# Patient Record
Sex: Male | Born: 1980 | Race: White | Hispanic: No | Marital: Single | Smoking: Former smoker
Health system: Southern US, Community
[De-identification: ages and names within clinical notes are randomized; demographics above are authoritative.]

## PROBLEM LIST (undated history)

## (undated) DIAGNOSIS — K589 Irritable bowel syndrome without diarrhea: Secondary | ICD-10-CM

## (undated) DIAGNOSIS — F4312 Post-traumatic stress disorder, chronic: Secondary | ICD-10-CM

## (undated) DIAGNOSIS — G43909 Migraine, unspecified, not intractable, without status migrainosus: Secondary | ICD-10-CM

## (undated) HISTORY — DX: Migraine, unspecified, not intractable, without status migrainosus: G43.909

## (undated) HISTORY — PX: AMPUTATION FINGER: SHX6594

## (undated) HISTORY — PX: UPPER GASTROINTESTINAL ENDOSCOPY: SHX188

## (undated) HISTORY — PX: COLONOSCOPY: SHX5424

---

## 2008-10-31 ENCOUNTER — Emergency Department (HOSPITAL_COMMUNITY): Admission: EM | Admit: 2008-10-31 | Discharge: 2008-10-31 | Payer: Self-pay | Admitting: Emergency Medicine

## 2011-08-05 DIAGNOSIS — F4312 Post-traumatic stress disorder, chronic: Secondary | ICD-10-CM | POA: Insufficient documentation

## 2011-08-05 DIAGNOSIS — F419 Anxiety disorder, unspecified: Secondary | ICD-10-CM | POA: Insufficient documentation

## 2011-09-10 DIAGNOSIS — J029 Acute pharyngitis, unspecified: Secondary | ICD-10-CM | POA: Insufficient documentation

## 2012-04-14 DIAGNOSIS — S335XXA Sprain of ligaments of lumbar spine, initial encounter: Secondary | ICD-10-CM | POA: Insufficient documentation

## 2012-04-14 DIAGNOSIS — S76019A Strain of muscle, fascia and tendon of unspecified hip, initial encounter: Secondary | ICD-10-CM | POA: Insufficient documentation

## 2019-04-12 ENCOUNTER — Encounter: Payer: Self-pay | Admitting: *Deleted

## 2019-04-12 ENCOUNTER — Other Ambulatory Visit: Payer: Self-pay

## 2019-04-12 ENCOUNTER — Emergency Department
Admission: EM | Admit: 2019-04-12 | Discharge: 2019-04-12 | Disposition: A | Discharge status: Home / Self Care | Payer: Self-pay | Source: Home / Self Care | Attending: Family Medicine | Admitting: Family Medicine

## 2019-04-12 DIAGNOSIS — Z8719 Personal history of other diseases of the digestive system: Secondary | ICD-10-CM

## 2019-04-12 DIAGNOSIS — R197 Diarrhea, unspecified: Secondary | ICD-10-CM

## 2019-04-12 DIAGNOSIS — Z1159 Encounter for screening for other viral diseases: Secondary | ICD-10-CM

## 2019-04-12 DIAGNOSIS — J069 Acute upper respiratory infection, unspecified: Secondary | ICD-10-CM

## 2019-04-12 DIAGNOSIS — B9789 Other viral agents as the cause of diseases classified elsewhere: Secondary | ICD-10-CM

## 2019-04-12 HISTORY — DX: Irritable bowel syndrome, unspecified: K58.9

## 2019-04-12 LAB — POCT CBC W AUTO DIFF (K'VILLE URGENT CARE)

## 2019-04-12 LAB — POC SARS CORONAVIRUS 2 AG -  ED: SARS Coronavirus 2 Ag: NEGATIVE

## 2019-04-12 MED ORDER — BENZONATATE 200 MG PO CAPS: ORAL_CAPSULE | ORAL | 0 refills | Status: DC

## 2019-04-12 MED ORDER — DICYCLOMINE HCL 20 MG PO TABS: ORAL_TABLET | ORAL | 1 refills | Status: DC

## 2019-04-12 NOTE — ED Triage Notes (Signed)
Pt c/o vomiting,diarrhea, nausea, body aches, chills, sweats and nasal congestion x 2 days.

## 2019-04-12 NOTE — ED Provider Notes (Signed)
[facility]    CSN: [id number] Arrival date & time: 04/12/19  [id number]      History   Chief Complaint Chief Complaint  Patient presents with  . Emesis  . Diarrhea  . Generalized Body Aches    HPI Nathaniel Oneill is a 38 y.o. male.   About 5 days ago patient developed a partly productive cough with sinus congestion and very mild sore throat, associated with fatigue, myalgias, and headache.  Three days ago he developed intermittent nausea/vomiting, watery diarrhea, and chills/sweats.  He denies changes in taste/smell and pleuritic pain or shortness of breath.  He has a past history of pneumonia three times, but his present symptoms do not feel similar.  He has a history of IBS for which he has taken Bentyl in the past. In November 2019 he had C. Difficile colitis, and he reports that his present symptoms do not feel similar.  The history is provided by the patient.    Past Medical History:  Diagnosis Date  . IBS (irritable bowel syndrome)     There are no active problems to display for this patient.   Past Surgical History:  Procedure Laterality Date  . AMPUTATION FINGER    . COLONOSCOPY    . UPPER GASTROINTESTINAL ENDOSCOPY         Home Medications    Prior to Admission medications   Medication Sig Start Date End Date Taking? Authorizing Provider  benzonatate (TESSALON) 200 MG capsule Take one cap by mouth at bedtime as needed for cough.  May repeat in 4 to 6 hours 04/12/19   Lattie HawBeese, Clema Skousen A, MD  clonazePAM (KLONOPIN) 2 MG tablet TAKE 1 TABLET BY MOUTH TWICE A DAY AS NEEDED FOR ANXIETY 03/18/19   [provider]  dicyclomine (BENTYL) 20 MG tablet Take one tab PO TID prn diarrhea and abdominal cramping 04/12/19   Lattie HawBeese, Jelicia Nantz A, MD    Family History Family History  Problem Relation Age of Onset  . Heart failure Mother   . Diabetes Mother   . Hypertension Mother   . Migraines Mother   . Hypertension Father     Social History Social History    Tobacco Use  . Smoking status: Former Games developermoker  . Smokeless tobacco: Never Used  Substance Use Topics  . Alcohol use: Not Currently  . Drug use: Never     Allergies   Cephalexin and Duloxetine hcl   Review of Systems Review of Systems ? sore throat + cough No pleuritic pain No wheezing + nasal congestion + post-nasal drainage No sinus pain/pressure No itchy/red eyes No earache No hemoptysis No SOB No fever, + chills/sweats + nausea + vomiting No abdominal pain + diarrhea No urinary symptoms No skin rash + fatigue + myalgias + headache Used OTC meds without relief   Physical Exam Triage Vital Signs ED Triage Vitals  Enc Vitals Group     BP 04/12/19 1021 124/83     Pulse Rate 04/12/19 1021 76     Resp 04/12/19 1021 18     Temp 04/12/19 1021 98 F (36.7 C)     Temp Source 04/12/19 1021 Oral     SpO2 04/12/19 1021 98 %     Weight --      Height --      Head Circumference --      Peak Flow --      Pain Score 04/12/19 1022 0     Pain Loc --  Pain Edu? --      Excl. in [place]? --    No data found.  Updated Vital Signs BP 124/83 (BP Location: Right Arm)   Pulse 76   Temp 98 F (36.7 C) (Oral)   Resp 18   SpO2 98%   Visual Acuity Right Eye Distance:   Left Eye Distance:   Bilateral Distance:    Right Eye Near:   Left Eye Near:    Bilateral Near:     Physical Exam Nursing notes and Vital Signs reviewed. Appearance:  Patient appears stated age, and in no acute distress Eyes:  Pupils are equal, round, and reactive to light and accomodation.  Extraocular movement is intact.  Conjunctivae are not inflamed  Ears:  Canals normal (bilateral cerumen present but canals not occluded)  Tympanic membranes normal.  Nose:  Mildly congested turbinates.  No sinus tenderness.  Pharynx:  Normal Neck:  Supple.  Enlarged posterior/lateral nodes are palpated bilaterally, tender to palpation on the left.   Lungs:  Clear to auscultation.  Breath sounds are equal.   Moving air well. Heart:  Regular rate and rhythm without murmurs, rubs, or gallops.  Abdomen:  Vague diffuse mild without masses or hepatosplenomegaly.  Bowel sounds are present.  No CVA or flank tenderness.  Extremities:  No edema.  Skin:  No rash present.    UC Treatments / Results  Labs (all labs ordered are listed, but only abnormal results are displayed) Labs Reviewed -  SARS negative CBC:  WBC 6.6; LY 30.7; MO 4.9; GR 64.4; Hgb 15.5; Platelets 177  EKG   Radiology No results found.  Procedures Procedures (including critical care time)  Medications Ordered in UC Medications - No data to display  Initial Impression / Assessment and Plan / UC Course  I have reviewed the triage vital signs and the nursing notes.  Pertinent labs & imaging results that were available during my care of the patient were reviewed by me and considered in my medical decision making (see chart for details).    Normal WBC and negative SARS reassuring. Treat symptomatically for now  Rx for Bentyl 20mg  TID prn. Prescription written for Benzonatate [facility]) to take at bedtime for night-time cough.  Followup with Family Doctor if not improved in 7 to 10 days.   Final Clinical Impressions(s) / UC Diagnoses   Final diagnoses:  Viral URI with cough  Diarrhea, unspecified type  History of IBS     Discharge Instructions     Begin clear liquids (Pedialyte while having diarrhea) for about a day until improved, then advance to a [employer] (Bananas, Rice, Applesauce, Toast).  Then gradually resume a regular diet when tolerated.    Take plain guaifenesin (1200mg  extended release tabs such as Mucinex) twice daily, with plenty of water, for cough and congestion.  May add Pseudoephedrine (30mg , one or two every 4 to 6 hours) for sinus congestion.  Get adequate rest.     Stop all antihistamines for now, and other non-prescription cough/cold preparations. May take Ibuprofen 200mg , 4 tabs every 8 hours with  food for body aches, fever, etc.   If symptoms become significantly worse during the night or over the weekend, proceed to the local emergency room.     ED Prescriptions    Medication Sig Dispense Auth. Provider   dicyclomine (BENTYL) 20 MG tablet Take one tab PO TID prn diarrhea and abdominal cramping 20 tablet Kandra Nicolas, MD   benzonatate (TESSALON) 200 MG capsule Take  one cap by mouth at bedtime as needed for cough.  May repeat in 4 to 6 hours 15 capsule Cathren Harsh Tera Mater, MD         Lattie Haw, MD 04/12/19 [phone]

## 2019-04-12 NOTE — Discharge Instructions (Addendum)
Begin clear liquids (Pedialyte while having diarrhea) for about a day until improved, then advance to a [employer] (Bananas, Rice, Applesauce, Toast).  Then gradually resume a regular diet when tolerated.    Take plain guaifenesin (1200mg  extended release tabs such as Mucinex) twice daily, with plenty of water, for cough and congestion.  May add Pseudoephedrine (30mg , one or two every 4 to 6 hours) for sinus congestion.  Get adequate rest.     Stop all antihistamines for now, and other non-prescription cough/cold preparations. May take Ibuprofen 200mg , 4 tabs every 8 hours with food for body aches, fever, etc.   If symptoms become significantly worse during the night or over the weekend, proceed to the local emergency room.

## 2019-06-19 ENCOUNTER — Emergency Department (INDEPENDENT_AMBULATORY_CARE_PROVIDER_SITE_OTHER)
Admission: EM | Admit: 2019-06-19 | Discharge: 2019-06-19 | Disposition: A | Discharge status: Home / Self Care | Payer: PRIVATE HEALTH INSURANCE | Source: Home / Self Care

## 2019-06-19 ENCOUNTER — Other Ambulatory Visit: Payer: Self-pay

## 2019-06-19 DIAGNOSIS — J029 Acute pharyngitis, unspecified: Secondary | ICD-10-CM

## 2019-06-19 DIAGNOSIS — J020 Streptococcal pharyngitis: Secondary | ICD-10-CM

## 2019-06-19 DIAGNOSIS — R05 Cough: Secondary | ICD-10-CM

## 2019-06-19 DIAGNOSIS — R439 Unspecified disturbances of smell and taste: Secondary | ICD-10-CM

## 2019-06-19 DIAGNOSIS — R52 Pain, unspecified: Secondary | ICD-10-CM

## 2019-06-19 DIAGNOSIS — R059 Cough, unspecified: Secondary | ICD-10-CM

## 2019-06-19 LAB — POCT RAPID STREP A (OFFICE): Rapid Strep A Screen: NEGATIVE

## 2019-06-19 NOTE — ED Triage Notes (Signed)
States he has had a cough since Sat. Moving around creates discomfort, body aches & chills. Congestion, sore throat, hes taste o& smell are not as strong.

## 2019-06-19 NOTE — ED Provider Notes (Signed)
[name]    CSN: [id number] Arrival date & time: 06/19/19  0900      History   Chief Complaint Chief Complaint  Patient presents with  . Sore Throat    HPI Nathaniel Oneill is a 38 y.o. male.   HPI Nathaniel Oneill is a 38 y.o. male presenting to UC with c/o 2 days of worsening cold-like symptoms.  Associated congestion, sore throat, body aches and chills.  Today he noticed his taste and smell are not as strong as usual.  He has been able to eat and drink like normal. No known sick contacts but he would like to be tested for Covid. Denies n/v/d. No fever. No chest pain or SOB.   Past Medical History:  Diagnosis Date  . IBS (irritable bowel syndrome)     There are no active problems to display for this patient.   Past Surgical History:  Procedure Laterality Date  . AMPUTATION FINGER    . COLONOSCOPY    . UPPER GASTROINTESTINAL ENDOSCOPY         Home Medications    Prior to Admission medications   Medication Sig Start Date End Date Taking? Authorizing Provider  benzonatate (TESSALON) 200 MG capsule Take one cap by mouth at bedtime as needed for cough.  May repeat in 4 to 6 hours 04/12/19   Lattie Haw, MD  clonazePAM (KLONOPIN) 2 MG tablet TAKE 1 TABLET BY MOUTH TWICE A DAY AS NEEDED FOR ANXIETY 03/18/19   [provider]  dicyclomine (BENTYL) 20 MG tablet Take one tab PO TID prn diarrhea and abdominal cramping 04/12/19   Lattie Haw, MD    Family History Family History  Problem Relation Age of Onset  . Heart failure Mother   . Diabetes Mother   . Hypertension Mother   . Migraines Mother   . Hypertension Father     Social History Social History   Tobacco Use  . Smoking status: Former Games developer  . Smokeless tobacco: Never Used  Substance Use Topics  . Alcohol use: Not Currently  . Drug use: Never     Allergies   Cephalexin and Duloxetine hcl   Review of Systems Review of Systems  Constitutional: Positive for chills. Negative  for fever.  HENT: Positive for congestion and sore throat. Negative for ear pain, trouble swallowing and voice change.   Respiratory: Negative for cough and shortness of breath.   Cardiovascular: Negative for chest pain and palpitations.  Gastrointestinal: Negative for abdominal pain, diarrhea, nausea and vomiting.  Musculoskeletal:       Body aches  Skin: Negative for rash.  Neurological: Positive for headaches. Negative for dizziness and light-headedness.     Physical Exam Triage Vital Signs ED Triage Vitals  Enc Vitals Group     BP 06/19/19 0922 122/89     Pulse Rate 06/19/19 0922 73     Resp 06/19/19 0922 18     Temp 06/19/19 0922 98 F (36.7 C)     Temp Source 06/19/19 0922 Oral     SpO2 06/19/19 0922 96 %     Weight 06/19/19 0923 225 lb (102.1 kg)     Height --      Head Circumference --      Peak Flow --      Pain Score 06/19/19 0923 7     Pain Loc --      Pain Edu? --      Excl. in [place]? --    No  data found.  Updated Vital Signs BP 122/89 (BP Location: Left Arm)   Pulse 73   Temp 98 F (36.7 C) (Oral)   Resp 18   Wt 225 lb (102.1 kg)   SpO2 96%   Visual Acuity Right Eye Distance:   Left Eye Distance:   Bilateral Distance:    Right Eye Near:   Left Eye Near:    Bilateral Near:     Physical Exam Vitals signs and nursing note reviewed.  Constitutional:      Appearance: He is well-developed.  HENT:     Head: Normocephalic and atraumatic.     Right Ear: Tympanic membrane and ear canal normal.     Left Ear: Tympanic membrane and ear canal normal.     Nose: Nose normal.     Right Sinus: No maxillary sinus tenderness or frontal sinus tenderness.     Left Sinus: No maxillary sinus tenderness or frontal sinus tenderness.     Mouth/Throat:     Lips: Pink.     Mouth: Mucous membranes are moist.     Pharynx: Oropharynx is clear. Uvula midline.  Neck:     Musculoskeletal: Normal range of motion and neck supple.  Cardiovascular:     Rate and Rhythm: Normal  rate and regular rhythm.  Pulmonary:     Effort: Pulmonary effort is normal. No respiratory distress.     Breath sounds: Normal breath sounds. No stridor. No wheezing, rhonchi or rales.  Musculoskeletal: Normal range of motion.  Lymphadenopathy:     Cervical: No cervical adenopathy.  Skin:    General: Skin is warm and dry.  Neurological:     Mental Status: He is alert and oriented to person, place, and time.  Psychiatric:        Behavior: Behavior normal.      UC Treatments / Results  Labs (all labs ordered are listed, but only abnormal results are displayed) Labs Reviewed  NOVEL CORONAVIRUS, NAA  STREP A DNA PROBE  POCT RAPID STREP A (OFFICE)    EKG   Radiology No results found.  Procedures Procedures (including critical care time)  Medications Ordered in UC Medications - No data to display  Initial Impression / Assessment and Plan / UC Course  I have reviewed the triage vital signs and the nursing notes.  Pertinent labs & imaging results that were available during my care of the patient were reviewed by me and considered in my medical decision making (see chart for details).  Clinical Course as of Jun 19 921  Mon Jun 19, 2019  1660 SARS CORONAVIRUS 2 (TAT 6-24 HRS) Nasopharyngeal Nasopharyngeal Swab [EP]    Clinical Course User Index [EP] Noe Gens, PA-C     Covid test: pending Encouraged symptomatic tx AVS provided   Final Clinical Impressions(s) / UC Diagnoses   Final diagnoses:  Cough  Body aches  Sore throat  Decreased taste and smell     Discharge Instructions      No Primary Care Doctor: - Call [facility] at  [phone] - they can help you locate a primary care doctor that  accepts your insurance, provides certain services, etc. - Physician Referral Service- [phone]  You may take 500mg  acetaminophen every 4-6 hours or in combination with ibuprofen 400-600mg  every 6-8 hours as needed for pain, inflammation, and fever.   Be sure to well hydrated with clear liquids and get at least 8 hours of sleep at night, preferably more while sick.   Please  follow up with family medicine in 1 week if needed.   Due to concern for possibly having Covid-19, it is advised that you self-isolate at home until test results come back in 2-5 days.  If positive, it is recommended you stay isolated for at least 10 days after symptom onset and 24 hours after last fever without taking medication (whichever is longer), with improving symptoms.  If you MUST go out, please wear a mask at all times, limit contact with others.      ED Prescriptions    None     PDMP not reviewed this encounter.   Lurene Shadowhelps, Hau Sanor O, [place]-C 06/20/19 [phone]

## 2019-06-19 NOTE — Discharge Instructions (Signed)
°  No Primary Care Doctor: Call [facility] at  [phone] - they can help you locate a primary care doctor that  accepts your insurance, provides certain services, etc. Physician Referral Service- [phone]  You may take 500mg  acetaminophen every 4-6 hours or in combination with ibuprofen 400-600mg  every 6-8 hours as needed for pain, inflammation, and fever.  Be sure to well hydrated with clear liquids and get at least 8 hours of sleep at night, preferably more while sick.   Please follow up with family medicine in 1 week if needed.   Due to concern for possibly having Covid-19, it is advised that you self-isolate at home until test results come back in 2-5 days.  If positive, it is recommended you stay isolated for at least 10 days after symptom onset and 24 hours after last fever without taking medication (whichever is longer), with improving symptoms.  If you MUST go out, please wear a mask at all times, limit contact with others.

## 2019-06-20 LAB — STREP A DNA PROBE: Group A Strep Probe: NOT DETECTED

## 2019-06-22 LAB — NOVEL CORONAVIRUS, NAA: SARS-CoV-2, NAA: NOT DETECTED

## 2019-08-03 ENCOUNTER — Other Ambulatory Visit: Payer: Self-pay

## 2019-08-03 ENCOUNTER — Emergency Department (INDEPENDENT_AMBULATORY_CARE_PROVIDER_SITE_OTHER)
Admission: EM | Admit: 2019-08-03 | Discharge: 2019-08-03 | Disposition: A | Discharge status: Home / Self Care | Payer: Commercial Managed Care - PPO | Source: Home / Self Care | Attending: Family Medicine | Admitting: Family Medicine

## 2019-08-03 ENCOUNTER — Encounter: Payer: Self-pay | Admitting: *Deleted

## 2019-08-03 DIAGNOSIS — J069 Acute upper respiratory infection, unspecified: Secondary | ICD-10-CM

## 2019-08-03 DIAGNOSIS — R059 Cough, unspecified: Secondary | ICD-10-CM

## 2019-08-03 DIAGNOSIS — R05 Cough: Secondary | ICD-10-CM

## 2019-08-03 NOTE — Discharge Instructions (Addendum)
Take plain guaifenesin (1200mg  extended release tabs such as Mucinex) twice daily, with plenty of water, for cough and congestion.  May add Pseudoephedrine (30mg , one or two every 4 to 6 hours) for sinus congestion.  Get adequate rest.   May use Afrin nasal spray (or generic oxymetazoline) each morning for about 5 days and then discontinue.  Also recommend using saline nasal spray several times daily and saline nasal irrigation (AYR is a common brand).  Use Flonase nasal spray each morning after using Afrin nasal spray and saline nasal irrigation. Try warm salt water gargles for sore throat.  Stop all antihistamines for now, and other non-prescription cough/cold preparations. May take Ibuprofen 200mg , 4 tabs every 8 hours with food for body aches, headache, fever, etc. May take Delsym Cough Suppressant at bedtime for nighttime cough.   Isolate yourself until COVID-19 test result is available.   If your COVID19 test is positive, then you are infected with the novel coronavirus and could give the virus to others.  Please continue isolation at home for at least 10 days since the start of your symptoms.  Once you complete your 10 day quarantine, you may return to normal activities as long as you've not had a fever for over 24 hours (without taking fever reducing medicine) and your symptoms are improving. Please continue good preventive care measures, including:  frequent hand-washing, avoid touching your face, cover coughs/sneezes, stay out of crowds and keep a 6 foot distance from others.  Go to the nearest hospital emergency room if fever/cough/breathlessness are severe or illness seems like a threat to life.

## 2019-08-03 NOTE — ED Triage Notes (Addendum)
Pt co body aches, back aches, mild cough, diarrhea, vomiting, nausea, and temp 100.1 x 2 days.

## 2019-08-03 NOTE — ED Provider Notes (Signed)
[facility]    CSN: [id number] Arrival date & time: 08/03/19  1135      History   Chief Complaint Chief Complaint  Patient presents with  . Generalized Body Aches    HPI Nathaniel Oneill is a 39 y.o. male.   Four days ago patient developed myalgias, headache, fatigue, minimal sore throat, nasal discharge, and nausea/vomiting/diarrhea.  He has developed a cough, worse at night, fever to 100+, chills/sweats, and changes in taste/smell.  He denies shortness of breath and pleuritic pain.  The history is provided by the patient.    Past Medical History:  Diagnosis Date  . IBS (irritable bowel syndrome)     There are no problems to display for this patient.   Past Surgical History:  Procedure Laterality Date  . AMPUTATION FINGER    . COLONOSCOPY    . UPPER GASTROINTESTINAL ENDOSCOPY         Home Medications    Prior to Admission medications   Medication Sig Start Date End Date Taking? Authorizing Provider  clonazePAM (KLONOPIN) 2 MG tablet TAKE 1 TABLET BY MOUTH TWICE A DAY AS NEEDED FOR ANXIETY 03/18/19  Yes [provider]    Family History Family History  Problem Relation Age of Onset  . Heart failure Mother   . Diabetes Mother   . Hypertension Mother   . Migraines Mother   . Hypertension Father     Social History Social History   Tobacco Use  . Smoking status: Former Games developer  . Smokeless tobacco: Never Used  Substance Use Topics  . Alcohol use: Not Currently  . Drug use: Never     Allergies   Cephalexin and Duloxetine hcl   Review of Systems Review of Systems + sore throat + cough No pleuritic pain No wheezing + nasal congestion + post-nasal drainage No sinus pain/pressure No itchy/red eyes No earache No hemoptysis No SOB + fever, + chills + nausea + vomiting No abdominal pain + diarrhea No urinary symptoms No skin rash + fatigue + myalgias + headache Used OTC meds (Tylenol) without relief   Physical  Exam Triage Vital Signs ED Triage Vitals  Enc Vitals Group     BP 08/03/19 1259 136/89     Pulse Rate 08/03/19 1259 79     Resp 08/03/19 1259 18     Temp 08/03/19 1259 97.9 F (36.6 C)     Temp Source 08/03/19 1259 Oral     SpO2 08/03/19 1259 97 %     Weight 08/03/19 1254 230 lb (104.3 kg)     Height 08/03/19 1254 6' (1.829 m)     Head Circumference --      Peak Flow --      Pain Score 08/03/19 1254 0     Pain Loc --      Pain Edu? --      Excl. in [place]? --    No data found.  Updated Vital Signs BP 136/89 (BP Location: Right Arm)   Pulse 79   Temp 97.9 F (36.6 C) (Oral)   Resp 18   Ht 6' (1.829 m)   Wt 104.3 kg   SpO2 97%   BMI 31.19 kg/m   Visual Acuity Right Eye Distance:   Left Eye Distance:   Bilateral Distance:    Right Eye Near:   Left Eye Near:    Bilateral Near:     Physical Exam Nursing notes and Vital Signs reviewed. Appearance:  Patient appears stated age, and  in no acute distress Eyes:  Pupils are equal, round, and reactive to light and accomodation.  Extraocular movement is intact.  Conjunctivae are not inflamed  Ears:  Canals normal.  Tympanic membranes normal.  Nose:  Mildly congested turbinates.  No sinus tenderness. Pharynx:  Normal Neck:  Supple.  Mildly enlarged lateral nodes are present, tender to palpation on the left.   Lungs:  Clear to auscultation.  Breath sounds are equal.  Moving air well. Heart:  Regular rate and rhythm without murmurs, rubs, or gallops.  Abdomen:  Nontender without masses or hepatosplenomegaly.  Bowel sounds are present.  No CVA or flank tenderness.  Extremities:  No edema.  Skin:  No rash present.   UC Treatments / Results  Labs (all labs ordered are listed, but only abnormal results are displayed) Labs Reviewed  NOVEL CORONAVIRUS, NAA    EKG   Radiology No results found.  Procedures Procedures (including critical care time)  Medications Ordered in UC Medications - No data to display  Initial  Impression / Assessment and Plan / UC Course  I have reviewed the triage vital signs and the nursing notes.  Pertinent labs & imaging results that were available during my care of the patient were reviewed by me and considered in my medical decision making (see chart for details).    There is no evidence of bacterial infection today.  Suspect COVID19. COVID19 send out. Treat symptomatically for now    Final Clinical Impressions(s) / UC Diagnoses   Final diagnoses:  Cough  Viral URI with cough     Discharge Instructions     Take plain guaifenesin (1200mg  extended release tabs such as Mucinex) twice daily, with plenty of water, for cough and congestion.  May add Pseudoephedrine (30mg , one or two every 4 to 6 hours) for sinus congestion.  Get adequate rest.   May use Afrin nasal spray (or generic oxymetazoline) each morning for about 5 days and then discontinue.  Also recommend using saline nasal spray several times daily and saline nasal irrigation (AYR is a common brand).  Use Flonase nasal spray each morning after using Afrin nasal spray and saline nasal irrigation. Try warm salt water gargles for sore throat.  Stop all antihistamines for now, and other non-prescription cough/cold preparations. May take Ibuprofen 200mg , 4 tabs every 8 hours with food for body aches, headache, fever, etc. May take Delsym Cough Suppressant at bedtime for nighttime cough.   Isolate yourself until COVID-19 test result is available.   If your COVID19 test is positive, then you are infected with the novel coronavirus and could give the virus to others.  Please continue isolation at home for at least 10 days since the start of your symptoms.  Once you complete your 10 day quarantine, you may return to normal activities as long as you've not had a fever for over 24 hours (without taking fever reducing medicine) and your symptoms are improving. Please continue good preventive care measures, including:  frequent  hand-washing, avoid touching your face, cover coughs/sneezes, stay out of crowds and keep a 6 foot distance from others.  Go to the nearest hospital emergency room if fever/cough/breathlessness are severe or illness seems like a threat to life.     ED Prescriptions    None        Kandra Nicolas, MD 08/05/19 1347

## 2019-08-05 LAB — NOVEL CORONAVIRUS, NAA: SARS-CoV-2, NAA: NOT DETECTED

## 2020-02-01 ENCOUNTER — Other Ambulatory Visit: Payer: Self-pay

## 2020-02-01 ENCOUNTER — Encounter: Payer: Self-pay | Admitting: Emergency Medicine

## 2020-02-01 ENCOUNTER — Emergency Department (INDEPENDENT_AMBULATORY_CARE_PROVIDER_SITE_OTHER)
Admission: EM | Admit: 2020-02-01 | Discharge: 2020-02-01 | Disposition: A | Discharge status: Home / Self Care | Payer: Commercial Managed Care - PPO | Source: Home / Self Care

## 2020-02-01 ENCOUNTER — Emergency Department (INDEPENDENT_AMBULATORY_CARE_PROVIDER_SITE_OTHER): Payer: Commercial Managed Care - PPO

## 2020-02-01 DIAGNOSIS — M79674 Pain in right toe(s): Secondary | ICD-10-CM | POA: Diagnosis not present

## 2020-02-01 DIAGNOSIS — M79671 Pain in right foot: Secondary | ICD-10-CM

## 2020-02-01 DIAGNOSIS — R0789 Other chest pain: Secondary | ICD-10-CM

## 2020-02-01 DIAGNOSIS — M25571 Pain in right ankle and joints of right foot: Secondary | ICD-10-CM

## 2020-02-01 HISTORY — DX: Post-traumatic stress disorder, chronic: F43.12

## 2020-02-01 NOTE — Discharge Instructions (Signed)
  You may take 500mg  acetaminophen every 4-6 hours or in combination with ibuprofen 400-600mg  every 6-8 hours as needed for pain and inflammation.  Call to schedule a follow up exam in 1 week with Sports Medicine if not improving. It is also recommended you establish care with a primary care provider for ongoing healthcare needs.
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
## 2020-02-12 ENCOUNTER — Ambulatory Visit (INDEPENDENT_AMBULATORY_CARE_PROVIDER_SITE_OTHER): Payer: Self-pay | Admitting: Sports Medicine

## 2020-02-12 ENCOUNTER — Encounter: Payer: Self-pay | Admitting: Sports Medicine

## 2020-02-12 ENCOUNTER — Other Ambulatory Visit: Payer: Self-pay

## 2020-02-12 ENCOUNTER — Ambulatory Visit (INDEPENDENT_AMBULATORY_CARE_PROVIDER_SITE_OTHER): Payer: Self-pay

## 2020-02-12 DIAGNOSIS — M5416 Radiculopathy, lumbar region: Secondary | ICD-10-CM

## 2020-02-12 DIAGNOSIS — M79671 Pain in right foot: Secondary | ICD-10-CM

## 2020-02-12 MED ORDER — PREDNISONE 50 MG PO TABS: ORAL_TABLET | ORAL | 0 refills | Status: DC
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
## 2020-02-22 ENCOUNTER — Ambulatory Visit (INDEPENDENT_AMBULATORY_CARE_PROVIDER_SITE_OTHER): Payer: Self-pay | Admitting: Rehabilitative and Restorative Service Providers"

## 2020-02-22 ENCOUNTER — Other Ambulatory Visit: Payer: Self-pay

## 2020-02-22 DIAGNOSIS — R2689 Other abnormalities of gait and mobility: Secondary | ICD-10-CM

## 2020-02-22 DIAGNOSIS — M5416 Radiculopathy, lumbar region: Secondary | ICD-10-CM

## 2020-02-22 DIAGNOSIS — R29810 Facial weakness: Secondary | ICD-10-CM

## 2020-02-22 DIAGNOSIS — M79671 Pain in right foot: Secondary | ICD-10-CM

## 2020-02-22 DIAGNOSIS — R29898 Other symptoms and signs involving the musculoskeletal system: Secondary | ICD-10-CM
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
## 2020-02-22 NOTE — Patient Instructions (Signed)
Access Code: [email or web address]: [email or web address]: 07/29/2021Prepared by: Belem Hintze HoltExercises  Prone Press Up - 2 x daily - 7 x weekly - 1 sets - 10 reps - 2-3 sec hold  Supine Piriformis Stretch with Leg Straight - 2 x daily - 7 x weekly - 1 sets - 3 reps - 30 sec hold  Hooklying Hamstring Stretch with Strap - 2 x daily - 7 x weekly - 1 sets - 3 reps - 30 sec hold  Long Sitting Ankle Pumps - 2 x daily - 7 x weekly - 1-2 sets - 10 reps - 2-3 sec hold  Seated Ankle Circles - 2 x daily - 7 x weekly - 1-2 sets - 10 reps - 2-3 sec hold Patient Education  [place] Patient Instructions

## 2020-03-05 ENCOUNTER — Ambulatory Visit: Payer: Commercial Managed Care - PPO | Admitting: Sports Medicine

## 2020-03-05 ENCOUNTER — Telehealth: Payer: Self-pay | Admitting: *Deleted

## 2020-03-05 NOTE — Telephone Encounter (Signed)
Pt left vm wanting a call back regarding his last appointment with Dr. Karie Schwalbe.  I turned his call and left vm.

## 2020-03-06 ENCOUNTER — Telehealth: Payer: Self-pay | Admitting: *Deleted

## 2020-03-06 ENCOUNTER — Ambulatory Visit (INDEPENDENT_AMBULATORY_CARE_PROVIDER_SITE_OTHER): Payer: Self-pay | Admitting: Physical Therapy

## 2020-03-06 ENCOUNTER — Other Ambulatory Visit: Payer: Self-pay

## 2020-03-06 ENCOUNTER — Encounter: Payer: Self-pay | Admitting: Physical Therapy

## 2020-03-06 DIAGNOSIS — R29898 Other symptoms and signs involving the musculoskeletal system: Secondary | ICD-10-CM

## 2020-03-06 DIAGNOSIS — M79671 Pain in right foot: Secondary | ICD-10-CM

## 2020-03-06 DIAGNOSIS — M5416 Radiculopathy, lumbar region: Secondary | ICD-10-CM

## 2020-03-06 NOTE — Patient Instructions (Signed)
Access Code: [email or web address]: [email or web address]: 08/11/2021Prepared by: [facility]  Prone Press Up - 2 x daily - 7 x weekly - 1 sets - 10 reps - 2-3 sec hold  Supine Piriformis Stretch with Leg Straight - 2 x daily - 7 x weekly - 1 sets - 3 reps - 30 sec hold  Hooklying Hamstring Stretch with Strap - 2 x daily - 7 x weekly - 1 sets - 3 reps - 30 sec hold  Long Sitting Ankle Pumps - 2 x daily - 7 x weekly - 1-2 sets - 10 reps - 2-3 sec hold  Seated Ankle Circles - 2 x daily - 7 x weekly - 1-2 sets - 10 reps - 2-3 sec hold  Seated Heel Toe Raises - 2 x daily - 7 x weekly - 1 sets - 10 reps  Seated Hamstring Stretch - 2 x daily - 7 x weekly - 1 sets - 2 reps - 20 hold  Seated Ankle Inversion Eversion AROM - 2 x daily - 7 x weekly - 1 sets - 10 reps   Pt instructed on exercises, but did not receive handout.
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
## 2020-03-06 NOTE — Therapy (Signed)
[facility] [place] [address] [place], [place], [address] Phone: [phone]   Fax:  [phone]  Physical Therapy Treatment  Patient Details  Name: Nathaniel Oneill MRN: [id number] Date of Birth: June 28, 1981 Referring Provider (PT): Dr Megan Salon    Encounter Date: 03/06/2020   PT End of Session - 03/06/20 1613    Visit Number 2    Number of Visits 12    Date for PT Re-Evaluation 04/04/20    PT Start Time 1608    PT Stop Time 1646    PT Time Calculation (min) 38 min    Activity Tolerance Patient tolerated treatment well    Behavior During Therapy [facility] for tasks assessed/performed           Past Medical History:  Diagnosis Date  . Chronic post-traumatic stress disorder (PTSD)   . IBS (irritable bowel syndrome)   . Migraine     Past Surgical History:  Procedure Laterality Date  . AMPUTATION FINGER    . COLONOSCOPY    . UPPER GASTROINTESTINAL ENDOSCOPY      There were no vitals filed for this visit.   Subjective Assessment - 03/06/20 1614    Subjective Pt reports his pain has improved some since last visit; he states he can bear weight in RLE with less pain. He had no burning in his foot until walking in parking lot.  He is still wearing his orthopedic shoe issued by Dr T.  He stopped doing his back exercises as they began to make back feel worse.    Pertinent History buldging disc L5/S1 2012 resolved; anxiety; allegries; no surgeries    Patient Stated Goals get rid of the foot pain    Currently in Pain? Yes    Pain Score 3     Pain Location Foot    Pain Orientation Right    Pain Descriptors / Indicators Aching    Aggravating Factors  weight on foot.    Pain Relieving Factors rest              [facility] PT Assessment - 03/06/20 0001      Assessment   Medical Diagnosis Rt foot pain: lumbar radiculitis     Referring Provider (PT) Dr Megan Salon     Onset Date/Surgical Date 01/24/20    Hand Dominance Right     Next MD Visit 03/05/20    Prior Therapy yes for HNP 2012           [facility] Adult PT Treatment/Exercise - 03/06/20 0001      Lumbar Exercises: Stretches   Passive Hamstring Stretch Right;2 reps;30 seconds   supine with strap    Passive Hamstring Stretch Limitations trial of Rt/Lt seated with straight back x 20 sec (no back pain)    Prone on Elbows Stretch 1 rep;30 seconds    Press Ups 3 reps;10 seconds    Piriformis Stretch Right;Left;30 seconds;2 reps   Trial seated     Lumbar Exercises: Aerobic   Nustep L4: legs only 6 min - cues to speed up and keep pedaling      Iontophoresis   Type of Iontophoresis Dexamethasone    Location medial to lateral Rt malleolus/lateral talus     Dose 80 mAmp; 1 cc    Time 4 hr       Manual Therapy   Manual Therapy Taping    Manual therapy comments I strips of Reg Rock tape applied to Rt dorsum of foot to decompress tissue, decrease pain  and increase proprioception.       Ankle Exercises: Supine   Other Supine Ankle Exercises ankle pumps x 10; ankle circles CW/CCW x 10 each Rt ankle       Ankle Exercises: Stretches   Gastroc Stretch 1 rep;30 seconds   long sitting with strap     Ankle Exercises: Seated   Other Seated Ankle Exercises heel/toe raises x 10 reps;  AROM bilat eversion x 10                  PT Education - 03/06/20 1705    Education Details rock tape rationale and safe removal;  HEP    Person(s) Educated Patient    Methods Explanation;Demonstration;Verbal cues    Comprehension Verbalized understanding;Returned demonstration               PT Long Term Goals - 02/22/20 1740      PT LONG TERM GOAL #1   Title Decrease pain 75-100% Rt foot and toes allowing patient to stand and walk with normal gait pattern for functional distances and times    Time 6    Period Weeks    Status New    Target Date 04/04/20      PT LONG TERM GOAL #2   Title 5/5 strength Rt LE    Time 6    Period Weeks    Status New    Target Date  04/04/20      PT LONG TERM GOAL #3   Title Improve Rt LE mobility and ROM with Rt LE equal to Lt    Time 6    Period Weeks    Status New    Target Date 04/04/20      PT LONG TERM GOAL #4   Title Independent in HEP    Time 6    Period Weeks    Status New    Target Date 04/04/20      PT LONG TERM GOAL #5   Title Improve FOTO to </= 35% limitation    Time 6    Period Weeks    Status New    Target Date 04/04/20                 Plan - 03/06/20 1656    Clinical Impression Statement Positive response to ionto last visit.  Pt had stopped performing travell stretch as he said it was irritating his back (he described it as "twisting his back"); will need to revisit this stretch with proper technique.  Pt's Rt foot fatigues quickly with PF/DF exercise.  Trial of Rock tape applied to dorsum of foot.  Goals are ongoing.    Rehab Potential Good    PT Frequency 2x / week    PT Duration 6 weeks    PT Treatment/Interventions Patient/family education;ADLs/Self Care Home Management;Aquatic Therapy;Cryotherapy;Electrical Stimulation;Iontophoresis 4mg /ml Dexamethasone;Moist Heat;Ultrasound;Gait training;Stair training;Functional mobility training;Therapeutic activities;Therapeutic exercise;Balance training;Neuromuscular re-education;Manual techniques;Dry needling;Taping;Vasopneumatic Device    PT Next Visit Plan review HEP; progress with ROM and strengthening for Rt foot and ankle; extension program for lumbar spine; stretching Rt LE; core stabilization ;gait training as needed; manual work and modalities as indicated; assess response to tape.    PT Home Exercise Plan [email or web address]    Consulted and Agree with Plan of Care Patient           Patient will benefit from skilled therapeutic intervention in order to improve the following deficits and impairments:     Visit Diagnosis: Left lumbar radiculitis  Pain in right foot  Other symptoms and signs involving the musculoskeletal  system     Problem List Patient Active Problem List   Diagnosis Date Noted  . Right lumbar radiculitis 02/12/2020  . Right foot pain 02/12/2020  . Anxiety disorder 08/05/2011  . Chronic post-traumatic stress disorder 08/05/2011   Mayer Camel, PTA 03/06/20 5:09 PM  [facility] [place] [address] [place], [place], [address] Phone: [phone]   Fax:  [phone]  Name: Jayshun Galentine MRN: [id number] Date of Birth: 1980-12-04

## 2020-03-06 NOTE — Telephone Encounter (Signed)
Yes that is fine, but I really need dates.  LOL.

## 2020-03-07 ENCOUNTER — Encounter: Payer: Self-pay | Admitting: Sports Medicine

## 2020-03-07 ENCOUNTER — Ambulatory Visit (INDEPENDENT_AMBULATORY_CARE_PROVIDER_SITE_OTHER): Payer: Self-pay | Admitting: Rehabilitative and Restorative Service Providers"

## 2020-03-07 ENCOUNTER — Encounter: Payer: Self-pay | Admitting: Rehabilitative and Restorative Service Providers"

## 2020-03-07 DIAGNOSIS — R29898 Other symptoms and signs involving the musculoskeletal system: Secondary | ICD-10-CM

## 2020-03-07 DIAGNOSIS — M79671 Pain in right foot: Secondary | ICD-10-CM

## 2020-03-07 DIAGNOSIS — M5416 Radiculopathy, lumbar region: Secondary | ICD-10-CM

## 2020-03-07 NOTE — Telephone Encounter (Signed)
Pt stated yesterday that the letter does not need specific dates.

## 2020-03-07 NOTE — Telephone Encounter (Signed)
Done

## 2020-03-07 NOTE — Telephone Encounter (Signed)
Pt notified of letter placed up front.

## 2020-03-07 NOTE — Therapy (Signed)
[facility] [place] [address] [place], [place], [address] Phone: [phone]   Fax:  [phone]  Physical Therapy Treatment  Patient Details  Name: Nathaniel Oneill MRN: [id number] Date of Birth: 1980/08/24 Referring Provider (PT): Dr Megan Salon    Encounter Date: 03/07/2020   PT End of Session - 03/07/20 1619    Visit Number 3    Number of Visits 12    Date for PT Re-Evaluation 04/04/20    PT Start Time 1618    PT Stop Time 1658    PT Time Calculation (min) 40 min    Activity Tolerance Patient tolerated treatment well           Past Medical History:  Diagnosis Date  . Chronic post-traumatic stress disorder (PTSD)   . IBS (irritable bowel syndrome)   . Migraine     Past Surgical History:  Procedure Laterality Date  . AMPUTATION FINGER    . COLONOSCOPY    . UPPER GASTROINTESTINAL ENDOSCOPY      There were no vitals filed for this visit.   Subjective Assessment - 03/07/20 1624    Subjective Can tell his foot is getting better overall but it hurts more today. He has been out of the boot all day today and can really tell the foot and ankle is sore. Only a couple of episodes of burning today. Back is hurting some when it was not hurting before. Not doing the exercises again.    Currently in Pain? Yes    Pain Score 5     Pain Location Foot    Pain Orientation Right    Pain Descriptors / Indicators Sore;Aching    Pain Type Acute pain    Pain Onset More than a month ago    Pain Frequency Intermittent                             [facility] Adult PT Treatment/Exercise - 03/07/20 0001      Lumbar Exercises: Stretches   Passive Hamstring Stretch Right;2 reps;30 seconds   supine with strap    Press Ups 5 reps   2-3 sec hold    Piriformis Stretch Right;Left;30 seconds;2 reps   supine travell    Gastroc Stretch Right;2 reps;30 seconds      Lumbar Exercises: Aerobic   Nustep L5 UE(10) x 6 min        Iontophoresis   Type of Iontophoresis Dexamethasone    Location medial to lateral Rt malleolus/lateral talus     Dose 120 mAmp; 1 cc    Time 8 hr      Manual Therapy   Manual Therapy Taping    Manual therapy comments I strips of Reg Rock tape applied to Rt dorsum of foot to decompress tissue, decrease pain and increase proprioception.       Ankle Exercises: Stretches   Gastroc Stretch 2 reps;30 seconds   standing      Ankle Exercises: Supine   Other Supine Ankle Exercises ankle pumps x 10; ankle circles CW/CCW x 10 each Rt ankle                        PT Long Term Goals - 02/22/20 1740      PT LONG TERM GOAL #1   Title Decrease pain 75-100% Rt foot and toes allowing patient to stand and walk with normal gait pattern for functional distances and times  Time 6    Period Weeks    Status New    Target Date 04/04/20      PT LONG TERM GOAL #2   Title 5/5 strength Rt LE    Time 6    Period Weeks    Status New    Target Date 04/04/20      PT LONG TERM GOAL #3   Title Improve Rt LE mobility and ROM with Rt LE equal to Lt    Time 6    Period Weeks    Status New    Target Date 04/04/20      PT LONG TERM GOAL #4   Title Independent in HEP    Time 6    Period Weeks    Status New    Target Date 04/04/20      PT LONG TERM GOAL #5   Title Improve FOTO to </= 35% limitation    Time 6    Period Weeks    Status New    Target Date 04/04/20                 Plan - 03/07/20 1633    Clinical Impression Statement Patient reports increased soreness in the Rt foot today. He took the walking shoe off for the entire day today. No longer having pain in the Rt foot with wt bearing. Having soreness with walking. Continue with Ionto and taping for Rt foot.    Rehab Potential Good    PT Frequency 2x / week    PT Duration 6 weeks    PT Treatment/Interventions Patient/family education;ADLs/Self Care Home Management;Aquatic Therapy;Cryotherapy;Electrical  Stimulation;Iontophoresis 4mg /ml Dexamethasone;Moist Heat;Ultrasound;Gait training;Stair training;Functional mobility training;Therapeutic activities;Therapeutic exercise;Balance training;Neuromuscular re-education;Manual techniques;Dry needling;Taping;Vasopneumatic Device    PT Next Visit Plan review HEP; progress with ROM and strengthening for Rt foot and ankle; extension program for lumbar spine as tolerated; stretching Rt LE; core stabilization ;gait training as needed; manual work and modalities as indicated; assess response to tape.    PT Home Exercise Plan [email or web address]    Consulted and Agree with Plan of Care Patient           Patient will benefit from skilled therapeutic intervention in order to improve the following deficits and impairments:     Visit Diagnosis: Left lumbar radiculitis  Pain in right foot  Other symptoms and signs involving the musculoskeletal system     Problem List Patient Active Problem List   Diagnosis Date Noted  . Right lumbar radiculitis 02/12/2020  . Right foot pain 02/12/2020  . Anxiety disorder 08/05/2011  . Chronic post-traumatic stress disorder 08/05/2011    Nathaniel Oneill 10/03/2011 PT, MPH  03/07/2020, 5:07 PM  [facility] [address] [place], [place], [place] Phone: [phone]   Fax:  [phone]  Name: Nathaniel Oneill MRN: Jeannene Patella Date of Birth: 1981/06/08

## 2020-03-11 ENCOUNTER — Encounter: Payer: Commercial Managed Care - PPO | Admitting: Physical Therapy

## 2020-03-14 ENCOUNTER — Encounter: Payer: Self-pay | Admitting: Rehabilitative and Restorative Service Providers"

## 2020-03-14 ENCOUNTER — Other Ambulatory Visit: Payer: Self-pay

## 2020-03-14 ENCOUNTER — Ambulatory Visit (INDEPENDENT_AMBULATORY_CARE_PROVIDER_SITE_OTHER): Payer: Commercial Managed Care - PPO | Admitting: Rehabilitative and Restorative Service Providers"

## 2020-03-14 DIAGNOSIS — M79671 Pain in right foot: Secondary | ICD-10-CM | POA: Diagnosis not present

## 2020-03-14 DIAGNOSIS — R29810 Facial weakness: Secondary | ICD-10-CM

## 2020-03-14 DIAGNOSIS — R29898 Other symptoms and signs involving the musculoskeletal system: Secondary | ICD-10-CM | POA: Diagnosis not present

## 2020-03-14 DIAGNOSIS — M5416 Radiculopathy, lumbar region: Secondary | ICD-10-CM | POA: Diagnosis not present

## 2020-03-14 DIAGNOSIS — R2689 Other abnormalities of gait and mobility: Secondary | ICD-10-CM

## 2020-03-14 NOTE — Patient Instructions (Addendum)
Trigger Point Dry Needling  . What is Trigger Point Dry Needling (DN)? o DN is a physical therapy technique used to treat muscle pain and dysfunction. Specifically, DN helps deactivate muscle trigger points (muscle knots).  o A thin filiform needle is used to penetrate the skin and stimulate the underlying trigger point. The goal is for a local twitch response (LTR) to occur and for the trigger point to relax. No medication of any kind is injected during the procedure.   . What Does Trigger Point Dry Needling Feel Like?  o The procedure feels different for each individual patient. Some patients report that they do not actually feel the needle enter the skin and overall the process is not painful. Very mild bleeding may occur. However, many patients feel a deep cramping in the muscle in which the needle was inserted. This is the local twitch response.   Marland Kitchen How Will I feel after the treatment? o Soreness is normal, and the onset of soreness may not occur for a few hours. Typically this soreness does not last longer than two days.  o Bruising is uncommon, however; ice can be used to decrease any possible bruising.  o In rare cases feeling tired or nauseous after the treatment is normal. In addition, your symptoms may get worse before they get better, this period will typically not last longer than 24 hours.   . What Can I do After My Treatment? o Increase your hydration by drinking more water for the next 24 hours. o You may place ice or heat on the areas treated that have become sore, however, do not use heat on inflamed or bruised areas. Heat often brings more relief post needling. o You can continue your regular activities, but vigorous activity is not recommended initially after the treatment for 24 hours. o DN is best combined with other physical therapy such as strengthening, stretching, and other therapies.   Access Code: [email or web address]: [email or web address]: 08/19/2021Prepared  by: Bless Lisenby HoltExercises  Prone Press Up - 2 x daily - 7 x weekly - 1 sets - 10 reps - 2-3 sec hold  Supine Piriformis Stretch with Leg Straight - 2 x daily - 7 x weekly - 1 sets - 3 reps - 30 sec hold  Hooklying Hamstring Stretch with Strap - 2 x daily - 7 x weekly - 1 sets - 3 reps - 30 sec hold  Long Sitting Ankle Pumps - 2 x daily - 7 x weekly - 1-2 sets - 10 reps - 2-3 sec hold  Seated Ankle Circles - 2 x daily - 7 x weekly - 1-2 sets - 10 reps - 2-3 sec hold  Seated Heel Toe Raises - 2 x daily - 7 x weekly - 1 sets - 10 reps  Seated Hamstring Stretch - 2 x daily - 7 x weekly - 1 sets - 2 reps - 20 hold  Seated Ankle Inversion Eversion AROM - 2 x daily - 7 x weekly - 1 sets - 10 reps  Ankle Dorsiflexion with Resistance - 2 x daily - 7 x weekly - 1 sets - 3 reps - 30 sec hold  Ankle Eversion with Resistance - 2 x daily - 7 x weekly - 1 sets - 3 reps - 30 sec hold  Ankle Inversion with Resistance - 2 x daily - 7 x weekly - 1 sets - 3 reps - 30 sec hold  Ankle and Toe Plantarflexion with Resistance - 2 x daily - 7 x weekly -  1 sets - 3 reps - 30 sec hold

## 2020-03-14 NOTE — Therapy (Addendum)
[facility] [place] [address] [place], [place], [address] Phone: [phone]   Fax:  [phone]  Physical Therapy Treatment  Patient Details  Name: Nathaniel Oneill MRN: [id number] Date of Birth: 11/10/1980 Referring Provider (PT): Dr Megan Salon    Encounter Date: 03/14/2020   PT End of Session - 03/14/20 1625    Visit Number 4    Number of Visits 12    Date for PT Re-Evaluation 04/04/20    PT Start Time 1621    PT Stop Time 1700    PT Time Calculation (min) 39 min    Activity Tolerance Patient tolerated treatment well           Past Medical History:  Diagnosis Date  . Chronic post-traumatic stress disorder (PTSD)   . IBS (irritable bowel syndrome)   . Migraine     Past Surgical History:  Procedure Laterality Date  . AMPUTATION FINGER    . COLONOSCOPY    . UPPER GASTROINTESTINAL ENDOSCOPY      There were no vitals filed for this visit.   Subjective Assessment - 03/14/20 1626    Subjective Patient reports that he had Rt foot pain all day Tuesday and was burning and painful. He does not know of anythig that irritated the foot. It has been better yesterday and today. Back is still irritated from stretching exercise(piriformis with figure 4)    Currently in Pain? Yes    Pain Score 2     Pain Location Foot    Pain Orientation Right    Pain Descriptors / Indicators Aching;Sore              [facility] PT Assessment - 03/14/20 0001      Assessment   Medical Diagnosis Rt foot pain: lumbar radiculitis     Referring Provider (PT) Dr Megan Salon     Onset Date/Surgical Date 01/24/20    Hand Dominance Right    Next MD Visit 03/05/20    Prior Therapy yes for HNP 2012      AROM   Lumbar Flexion 100%    Lumbar Extension 50% soreness     Lumbar - Right Side Bend 85%    Lumbar - Left Side Bend 80% pain Lt LB     Lumbar - Right Rotation 70% tight     Lumbar - Left Rotation 60% tight       Strength   Right Ankle  Dorsiflexion 4+/5    Right Ankle Plantar Flexion 4+/5    Right Ankle Inversion --   5-/5   Right Ankle Eversion 4+/5    Left Ankle Dorsiflexion 5/5    Left Ankle Plantar Flexion 5/5    Left Ankle Inversion 5/5    Left Ankle Eversion 5/5      Palpation   Spinal mobility hypomobile lumbar spine with CPA and lateral mobs     Palpation comment muscular tightness Lt QL/lats; piriformis; gluts; tender to palpation dorsum of Rt foot through the forefoot and toward toes; medial to lateral malleolus Rt foot               Trial of DN to Lt lower lumbar paraspinals followed by MH to LB x 10 min   Noted good release of muscular tightness thorough the lumbar musculature.            [facility] Adult PT Treatment/Exercise - 03/14/20 0001      Lumbar Exercises: Standing   Heel Raises 10 reps  Iontophoresis   Type of Iontophoresis Dexamethasone    Location medial to lateral Rt malleolus/lateral talus     Dose 120 mAmp; 1 cc    Time 8 hr      Manual Therapy   Manual Therapy Taping    Manual therapy comments I strips of Reg Rock tape applied to Rt dorsum of foot to decompress tissue, decrease pain and increase proprioception.       Ankle Exercises: Supine   T-Band DF/EVER/INVER/PF red TB x 10 each direction     Other Supine Ankle Exercises ankle pumps x 10; ankle circles CW/CCW x 10 each Rt ankle       Ankle Exercises: Stretches   Gastroc Stretch 2 reps;30 seconds   standing                  PT Education - 03/14/20 1704    Education Details DN HEP    Person(s) Educated Patient    Methods Explanation;Demonstration;Tactile cues;Verbal cues;Handout    Comprehension Verbalized understanding;Returned demonstration;Verbal cues required;Tactile cues required               PT Long Term Goals - 02/22/20 1740      PT LONG TERM GOAL #1   Title Decrease pain 75-100% Rt foot and toes allowing patient to stand and walk with normal gait pattern for functional distances and  times    Time 6    Period Weeks    Status New    Target Date 04/04/20      PT LONG TERM GOAL #2   Title 5/5 strength Rt LE    Time 6    Period Weeks    Status New    Target Date 04/04/20      PT LONG TERM GOAL #3   Title Improve Rt LE mobility and ROM with Rt LE equal to Lt    Time 6    Period Weeks    Status New    Target Date 04/04/20      PT LONG TERM GOAL #4   Title Independent in HEP    Time 6    Period Weeks    Status New    Target Date 04/04/20      PT LONG TERM GOAL #5   Title Improve FOTO to </= 35% limitation    Time 6    Period Weeks    Status New    Target Date 04/04/20                 Plan - 03/14/20 1628    Clinical Impression Statement Foot was worse on Tuesday but has felt better since. He still has some incresaed pain in the LB which was irritated from therapy. ROM and mobility are increased with decreased pain. Ankle ROM and strength are improved. Trial of DN to Lt lumbar musculature. Added ankle TB exercises.    Rehab Potential Good    PT Frequency 2x / week    PT Duration 6 weeks    PT Treatment/Interventions Patient/family education;ADLs/Self Care Home Management;Aquatic Therapy;Cryotherapy;Electrical Stimulation;Iontophoresis 4mg /ml Dexamethasone;Moist Heat;Ultrasound;Gait training;Stair training;Functional mobility training;Therapeutic activities;Therapeutic exercise;Balance training;Neuromuscular re-education;Manual techniques;Dry needling;Taping;Vasopneumatic Device    PT Next Visit Plan review HEP; progress with ROM and strengthening for Rt foot and ankle; extension program for lumbar spine as tolerated; stretching Rt LE; core stabilization ;gait training as needed; manual work and modalities as indicated; assess response to DN Lt lumbar and TB ankle exercises    PT Home Exercise Plan  [email or web address]    Consulted and Agree with Plan of Care Patient           Patient will benefit from skilled therapeutic intervention in order to improve the  following deficits and impairments:     Visit Diagnosis: Left lumbar radiculitis  Pain in right foot  Other symptoms and signs involving the musculoskeletal system  Facial weakness  Other abnormalities of gait and mobility     Problem List Patient Active Problem List   Diagnosis Date Noted  . Right lumbar radiculitis 02/12/2020  . Right foot pain 02/12/2020  . Anxiety disorder 08/05/2011  . Chronic post-traumatic stress disorder 08/05/2011    Gretta Samons Rober Minion PT, MPH  03/14/2020, 5:06 PM  [facility] [address] [place], [place], [address] Phone: [phone]   Fax:  [phone]  Name: Nathaniel Oneill MRN: [id number] Date of Birth: Sep 26, 1980

## 2020-03-18 ENCOUNTER — Encounter: Payer: Commercial Managed Care - PPO | Admitting: Physical Therapy

## 2020-03-21 ENCOUNTER — Encounter: Payer: Commercial Managed Care - PPO | Admitting: Rehabilitative and Restorative Service Providers"

## 2020-03-22 ENCOUNTER — Ambulatory Visit (INDEPENDENT_AMBULATORY_CARE_PROVIDER_SITE_OTHER): Payer: Self-pay | Admitting: Physical Therapy

## 2020-03-22 ENCOUNTER — Encounter: Payer: Self-pay | Admitting: Physical Therapy

## 2020-03-22 ENCOUNTER — Other Ambulatory Visit: Payer: Self-pay

## 2020-03-22 DIAGNOSIS — M79671 Pain in right foot: Secondary | ICD-10-CM

## 2020-03-22 DIAGNOSIS — R29898 Other symptoms and signs involving the musculoskeletal system: Secondary | ICD-10-CM

## 2020-03-22 DIAGNOSIS — M5416 Radiculopathy, lumbar region: Secondary | ICD-10-CM

## 2020-03-22 NOTE — Patient Instructions (Signed)
Provided information on DN at last visit

## 2020-03-22 NOTE — Therapy (Addendum)
[place], [place], [address] Phone: [phone]   Fax:  [phone]  Physical Therapy Treatment  Patient Details  Name: Nathaniel Oneill MRN: [id number] Date of Birth: [birth year] Referring Provider (PT): Dr Sophronia Simas    Encounter Date: 03/22/2020   PT End of Session - 03/22/20 1126    Visit Number 5    Number of Visits 12    Date for PT Re-Evaluation 04/04/20    PT Start Time 1101    PT Stop Time 1150   MHP last 10 min   PT Time Calculation (min) 49 min    Activity Tolerance Patient tolerated treatment well           Past Medical History:  Diagnosis Date  . Chronic post-traumatic stress disorder (PTSD)   . IBS (irritable bowel syndrome)   . Migraine     Past Surgical History:  Procedure Laterality Date  . AMPUTATION FINGER    . COLONOSCOPY    . UPPER GASTROINTESTINAL ENDOSCOPY      There were no vitals filed for this visit.   Subjective Assessment - 03/22/20 1105    Subjective Pt reports he is able to bear weight through Rt foot without pain, however he continues to have sporadic burning on top of Rt foot.  "The needling in low back was amazing."  He states that the knot in his low back is resolved.   He would like to work on his Rt foot.    Patient Stated Goals get rid of the foot pain    Currently in Pain? Yes    Pain Score 2     Pain Location Foot    Pain Orientation Right    Pain Descriptors / Indicators Sore    Aggravating Factors  prolonged walking    Pain Relieving Factors ?              [facility] PT Assessment - 03/22/20 0001      Assessment   Medical Diagnosis Rt foot pain: lumbar radiculitis     Referring Provider (PT) Dr Sophronia Simas     Onset Date/Surgical Date 01/24/20    Hand Dominance Right    Next MD Visit 03/05/20    Prior Therapy yes for HNP 2012      Observation/Other Assessments   Focus on Therapeutic Outcomes (FOTO)  35% limitation.       AROM    Lumbar Flexion WNL    Lumbar Extension WNL    Lumbar - Right Side Bend WNL   pain in Lt LB   Lumbar - Left Side Bend WNL     Lumbar - Right Rotation WNL   tight   Lumbar - Left Rotation WNL   tight     Strength   Right Ankle Dorsiflexion 4+/5    Right Ankle Inversion --   5-/5   Right Ankle Eversion 3+/5           [facility] Adult PT Treatment/Exercise - 03/22/20 0001      Lumbar Exercises: Stretches   Passive Hamstring Stretch Right;Left;1 rep;30 seconds    Prone on Elbows Stretch 1 rep;30 seconds    Press Ups 5 reps;5 seconds      Lumbar Exercises: Aerobic   Nustep L5 UE(10) x 6 min       Moist Heat Therapy   Number Minutes Moist Heat 10 Minutes    Moist Heat Location Lumbar Spine      Manual Therapy  Manual Therapy --   skilled palpation-assess tissue response to DN/manual work   Manual therapy comments I strips of [place] tape applied to Rt dorsum of foot to decompress tissue, decrease pain and increase proprioception.     Soft tissue mobilization STM Lt lumbar to proximal hip     Myofascial Release Lt lower lumbar       Ankle Exercises: Seated   Other Seated Ankle Exercises Rt ankle Eversion x 15 reps,  DF x 2 sets of 10 (some increase in burning in between sets.       Ankle Exercises: Stretches   Gastroc Stretch 2 reps;30 seconds   standing            Trigger Point Dry Needling - 03/22/20 0001    Consent Given? Yes    Education Handout Provided Yes    Dry Needling Comments Lt    Erector spinae Response Palpable increased muscle length             PT Long Term Goals - 03/22/20 1109      PT LONG TERM GOAL #1   Title Decrease pain 75-100% Rt foot and toes allowing patient to stand and walk with normal gait pattern for functional distances and times    Baseline 80% reduction    Time 6    Period Weeks    Status Achieved      PT LONG TERM GOAL #2   Title 5/5 strength Rt LE    Time 6    Period Weeks    Status Partially Met      PT LONG TERM GOAL #3    Title Improve Rt LE mobility and ROM with Rt LE equal to Lt    Time 6    Period Weeks    Status On-going      PT LONG TERM GOAL #4   Title Independent in HEP    Time 6    Period Weeks    Status On-going      PT LONG TERM GOAL #5   Title Improve FOTO to </= 35% limitation    Time 6    Period Weeks    Status Achieved                 Plan - 03/22/20 1241    Clinical Impression Statement Positive response to DN Lt lumbar musculature last visit. He awoke the following morning with no pain and his back feeling the best it has felt in 4 years. Pt's lumbar ROM has improved.  He continues with limited Rt ankle strength.  He has met LTG#1 and 5, partially met #2.  Making good gains towards remaining goals.    Rehab Potential Good    PT Frequency 2x / week    PT Duration 6 weeks    PT Treatment/Interventions Patient/family education;ADLs/Self Care Home Management;Aquatic Therapy;Cryotherapy;Electrical Stimulation;Iontophoresis 43m/ml Dexamethasone;Moist Heat;Ultrasound;Gait training;Stair training;Functional mobility training;Therapeutic activities;Therapeutic exercise;Balance training;Neuromuscular re-education;Manual techniques;Dry needling;Taping;Vasopneumatic Device    PT Next Visit Plan Pt requests to hold therapy until MD appt.  Will assess need for additional visits vs hold.    PT Home Exercise Plan [id number]    Consulted and Agree with Plan of Care Patient           Patient will benefit from skilled therapeutic intervention in order to improve the following deficits and impairments:     Visit Diagnosis: Left lumbar radiculitis  Pain in right foot  Other symptoms and signs involving the musculoskeletal system  Problem List Patient Active Problem List   Diagnosis Date Noted  . Right lumbar radiculitis 02/12/2020  . Right foot pain 02/12/2020  . Anxiety disorder 08/05/2011  . Chronic post-traumatic stress disorder 08/05/2011   Nathaniel Oneill,  PTA 03/22/20 12:47 PM   [name] PT, MPH 03/22/20 12:48 PM    [facility] [place], [place], [address] Phone: [phone]   Fax:  [phone]  Name: Nathaniel Oneill MRN: [id number] Date of Birth: 26-Dec-1980  PHYSICAL THERAPY DISCHARGE SUMMARY  Visits from Start of Care: 5  Current functional level related to goals / functional outcomes: See progress note for discharge status   Remaining deficits: Unknown    Education / Equipment: HEP  Plan: Patient agrees to discharge.  Patient goals were partially met. Patient is being discharged due to not returning since the last visit.  ?????    [name] PT, MPH 05/08/20 10:55 AM

## 2020-03-25 ENCOUNTER — Other Ambulatory Visit: Payer: Self-pay

## 2020-03-25 ENCOUNTER — Ambulatory Visit (INDEPENDENT_AMBULATORY_CARE_PROVIDER_SITE_OTHER): Payer: Self-pay | Admitting: Sports Medicine

## 2020-03-25 ENCOUNTER — Encounter: Payer: Self-pay | Admitting: Sports Medicine

## 2020-03-25 DIAGNOSIS — M79671 Pain in right foot: Secondary | ICD-10-CM

## 2020-03-25 DIAGNOSIS — M5416 Radiculopathy, lumbar region: Secondary | ICD-10-CM

## 2020-03-25 NOTE — Progress Notes (Signed)
    Procedures performed today:    None.  Independent interpretation of notes and tests performed by another provider:   None.  Brief History, Exam, Impression, and Recommendations:    Nathaniel Oneill is a pleasant 39yo male who presents today for reevaluation of radicular pain. He says the burning and tingling in his toes has mostly resolved after prednisone, PT, particulary dry needling.  He still complains of some lateral ankle pain. This pain is most likely coming from a sprain of is ankle. It is continuing to improve. The ankle is stable on exam. We are going to continue with conservative treatment and follow up in 1 month virtually for reevaluation.   Aurelio Jew, MS3   ___________________________________________ Ihor Austin. Benjamin Stain, M.D., ABFM., CAQSM. Primary Care and Sports Medicine [facility]  Adjunct Instructor of Family Medicine  [employer]
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
## 2020-03-25 NOTE — Assessment & Plan Note (Signed)
Ankle continues to improve, x-rays were negative, exam is benign today, very stable in spite of touch of pain at the ATFL, return as needed for this.
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
## 2020-04-22 ENCOUNTER — Other Ambulatory Visit: Payer: Self-pay

## 2020-04-22 ENCOUNTER — Telehealth (INDEPENDENT_AMBULATORY_CARE_PROVIDER_SITE_OTHER): Payer: Commercial Managed Care - PPO | Admitting: Sports Medicine

## 2020-04-22 DIAGNOSIS — M5416 Radiculopathy, lumbar region: Secondary | ICD-10-CM | POA: Diagnosis not present

## 2020-04-22 NOTE — Assessment & Plan Note (Signed)
Nathaniel Oneill has a history of L5-S1 DDD, he has had multiple epidurals in the past, physical therapy with [facility], he did have a right L5 distribution radiculitis. He responded well to prednisone, he never really got to do formal physical therapy. He has essentially no discomfort now, should he have any recurrence of pain or feel as though he wants to go to the neck step would be to revisit physical therapy, if that fails then we proceed with MRI for epidural planning.

## 2020-04-22 NOTE — Progress Notes (Signed)
   Virtual Visit via Telephone   I connected with  Nathaniel Oneill  on 04/22/20 by telephone/telehealth and verified that I am speaking with the correct person using two identifiers.   I discussed the limitations, risks, security and privacy concerns of performing an evaluation and management service by telephone, including the higher likelihood of inaccurate diagnosis and treatment, and the availability of in person appointments.  We also discussed the likely need of an additional face to face encounter for complete and high quality delivery of care.  I also discussed with the patient that there may be a patient responsible charge related to this service. The patient expressed understanding and wishes to proceed.  Provider location is in medical facility. Patient location is at their home, different from provider location. People involved in care of the patient during this telehealth encounter were myself, my nurse/medical assistant, and my front office/scheduling team member.  Review of Systems: No fevers, chills, night sweats, weight loss, chest pain, or shortness of breath.   Objective Findings:    General: Speaking full sentences, no audible heavy breathing.  Sounds alert and appropriately interactive.    Independent interpretation of tests performed by another provider:   None.  Brief History, Exam, Impression, and Recommendations:    Right lumbar radiculitis [place] has a history of L5-S1 DDD, he has had multiple epidurals in the past, physical therapy with [facility], he did have a right L5 distribution radiculitis. He responded well to prednisone, he never really got to do formal physical therapy. He has essentially no discomfort now, should he have any recurrence of pain or feel as though he wants to go to the neck step would be to revisit physical therapy, if that fails then we proceed with MRI for epidural planning.   I discussed the above assessment and treatment plan with the  patient. The patient was provided an opportunity to ask questions and all were answered. The patient agreed with the plan and demonstrated an understanding of the instructions.   The patient was advised to call back or seek an in-person evaluation if the symptoms worsen or if the condition fails to improve as anticipated.   I provided 30 minutes of face to face and non-face-to-face time during this encounter date, time was needed to gather information, review chart, records, communicate/coordinate with staff remotely, as well as complete documentation.   ___________________________________________ Ihor Austin. Benjamin Stain, M.D., ABFM., CAQSM. Primary Care and Sports Medicine [facility]  Adjunct Professor of Family Medicine  [employer]

## 2020-09-19 ENCOUNTER — Telehealth: Payer: Self-pay | Admitting: Emergency Medicine

## 2020-09-19 NOTE — Telephone Encounter (Signed)
ED notes printed out by registrar for patient - 2 identifiers confirmed by Calton Dach. Copies were poor quality - switched to nurse's station printer. Pt will pick up in person & bring ID per [place]

## 2021-03-10 ENCOUNTER — Ambulatory Visit: Payer: Commercial Managed Care - PPO

## 2021-07-03 ENCOUNTER — Other Ambulatory Visit: Payer: Self-pay

## 2021-07-03 ENCOUNTER — Emergency Department (INDEPENDENT_AMBULATORY_CARE_PROVIDER_SITE_OTHER)
Admission: EM | Admit: 2021-07-03 | Discharge: 2021-07-03 | Disposition: A | Discharge status: Home / Self Care | Payer: BC Managed Care – PPO | Source: Home / Self Care | Attending: Family Medicine | Admitting: Family Medicine

## 2021-07-03 DIAGNOSIS — J069 Acute upper respiratory infection, unspecified: Secondary | ICD-10-CM

## 2021-07-03 DIAGNOSIS — H6121 Impacted cerumen, right ear: Secondary | ICD-10-CM | POA: Diagnosis not present

## 2021-07-03 LAB — POC SARS CORONAVIRUS 2 AG -  ED: SARS Coronavirus 2 Ag: NEGATIVE

## 2021-07-03 LAB — POC INFLUENZA A AND B ANTIGEN (URGENT CARE ONLY)
Influenza A Ag: NEGATIVE
Influenza B Ag: NEGATIVE

## 2021-07-03 MED ORDER — AZITHROMYCIN 250 MG PO TABS: ORAL_TABLET | ORAL | 0 refills | Status: DC

## 2021-07-03 MED ORDER — GUAIFENESIN-CODEINE 100-10 MG/5ML PO SOLN: ORAL | 0 refills | Status: DC

## 2021-07-03 MED ORDER — NEOMYCIN-POLYMYXIN-HC 3.5-10000-1 OT SUSP: 4.0000 [drp] | Freq: Three times a day (TID) | OTIC | 0 refills | Status: DC

## 2021-07-03 NOTE — Discharge Instructions (Addendum)
Take plain guaifenesin (1200mg  extended release tabs such as Mucinex) twice daily, with plenty of water, for cough and congestion.  May add Pseudoephedrine (30mg , one or two every 4 to 6 hours) for sinus congestion.  Get adequate rest.   May use Afrin nasal spray (or generic oxymetazoline) each morning for about 5 days and then discontinue.  Also recommend using saline nasal spray several times daily and saline nasal irrigation (AYR is a common brand).  Use Flonase nasal spray each morning after using Afrin nasal spray and saline nasal irrigation. Try warm salt water gargles for sore throat.  Stop all antihistamines (Zyrtec, Nyquil, etc) for now, and other non-prescription cough/cold preparations. May take Tylenol as needed.   To prevent recurrent ear wax blockage, try the following: Soak two cotton balls with mineral oil, and gently place in each ear canal once weekly.  Leave the cotton balls in place for 10 to 20 minutes.  This will help liquefy the ear wax and aid your body's normal elimination process.  If applicable, do not use a hearing aid for 8 hours overnight.  Have your ears cleaned by a health professional every 6 to 12 months.  Avoid using "Q-tips" and ear wax softening solutions  Begin Azithromycin if not improving about 5 to 7 days or if persistent fever develops

## 2021-07-03 NOTE — ED Provider Notes (Signed)
Nathaniel Oneill CARE    CSN: [id number] Arrival date & time: 07/03/21  [id number]      History   Chief Complaint Chief Complaint  Patient presents with   Nasal Congestion   Cough   Sore Throat   Chills    HPI Nathaniel Oneill is a 40 y.o. male.   Four days ago patient developed sore throat, sinus congestion, mild cough, mild myalgias, and headache.  His cough became worse during the past three days, keeping him awake at night.  He has had persistent chills/sweats.  He had a negative home COVID19 test. He notes that he has had right ear congestion for about 5 months, now worse. He has had 3 episodes of pneumonia in the past (last episode 2016)  The history is provided by the patient.   Past Medical History:  Diagnosis Date   Chronic post-traumatic stress disorder (PTSD)    IBS (irritable bowel syndrome)    Migraine     Patient Active Problem List   Diagnosis Date Noted   Right lumbar radiculitis 02/12/2020   Right foot pain 02/12/2020   Anxiety disorder 08/05/2011   Chronic post-traumatic stress disorder 08/05/2011    Past Surgical History:  Procedure Laterality Date   AMPUTATION FINGER     COLONOSCOPY     UPPER GASTROINTESTINAL ENDOSCOPY         Home Medications    Prior to Admission medications   Medication Sig Start Date End Date Taking? Authorizing Provider  azithromycin (ZITHROMAX Z-PAK) 250 MG tablet Take 2 tabs today; then begin one tab once daily for 4 more days. 07/03/21  Yes Kandra Nicolas, MD  guaiFENesin-codeine 100-10 MG/5ML syrup Take 15mL by mouth at bedtime as needed for cough. 07/03/21  Yes Kandra Nicolas, MD  neomycin-polymyxin-hydrocortisone (CORTISPORIN) 3.5-10000-1 OTIC suspension Place 4 drops into the right ear 3 (three) times daily. Use for 5 to 7 days. 07/03/21  Yes Kandra Nicolas, MD  clonazePAM (KLONOPIN) 2 MG tablet TAKE 1 TABLET BY MOUTH TWICE A DAY AS NEEDED FOR ANXIETY 03/18/19   [provider]  ibuprofen (ADVIL) 800 MG  tablet Take by mouth. 04/14/12   [provider]    Family History Family History  Problem Relation Age of Onset   Heart failure Mother    Diabetes Mother    Hypertension Mother    Migraines Mother    Colon polyps Mother    Hypertension Father    Colon polyps Father     Social History Social History   Tobacco Use   Smoking status: Former   Smokeless tobacco: Never  Scientific laboratory technician Use: Never used  Substance Use Topics   Alcohol use: Not Currently   Drug use: Yes    Frequency: 1.0 times per week    Types: Marijuana     Allergies   Cephalexin and Duloxetine hcl   Review of Systems Review of Systems + sore throat + cough No pleuritic pain No wheezing + nasal congestion + post-nasal drainage No sinus pain/pressure No itchy/red eyes ? earache No hemoptysis No SOB No fever, + chills/sweats No nausea No vomiting No abdominal pain No diarrhea No urinary symptoms No skin rash + fatigue No myalgias + headache Used OTC meds (Tylenol, ibuprofen, Dayquil, Nyquil) without relief   Physical Exam Triage Vital Signs ED Triage Vitals  Enc Vitals Group     BP 07/03/21 0830 (!) 139/98     Pulse Rate 07/03/21 0830 90  Resp 07/03/21 0830 14     Temp 07/03/21 0830 99 F (37.2 C)     Temp Source 07/03/21 0830 Oral     SpO2 07/03/21 0830 96 %     Weight --      Height --      Head Circumference --      Peak Flow --      Pain Score 07/03/21 0832 4     Pain Loc --      Pain Edu? --      Excl. in GC? --    No data found.  Updated Vital Signs BP (!) 139/98 (BP Location: Right Arm)   Pulse 90   Temp 99 F (37.2 C) (Oral)   Resp 14   SpO2 96%   Visual Acuity Right Eye Distance:   Left Eye Distance:   Bilateral Distance:    Right Eye Near:   Left Eye Near:    Bilateral Near:     Physical Exam Nursing notes and Vital Signs reviewed. Appearance:  Patient appears stated age, and in no acute distress Eyes:  Pupils are equal, round, and  reactive to light and accomodation.  Extraocular movement is intact.  Conjunctivae are not inflamed  Ears:  Left canal and tympanic membrane normal.  Right canal completely occluded with cerumen.  Post lavage the right tympanic membrane is normal while right canal is slightly abraded. Nose:  Mildly congested turbinates.  No sinus tenderness.   Pharynx:  Normal Neck:  Supple.  Mildly enlarged lateral nodes are present, tender to palpation on the left.   Lungs:  Clear to auscultation.  Breath sounds are equal.  Moving air well. Heart:  Regular rate and rhythm without murmurs, rubs, or gallops.  Abdomen:  Nontender without masses or hepatosplenomegaly.  Bowel sounds are present.  No CVA or flank tenderness.  Extremities:  No edema.  Skin:  No rash present.   UC Treatments / Results  Labs (all labs ordered are listed, but only abnormal results are displayed) Labs Reviewed  POC INFLUENZA A AND B ANTIGEN (URGENT CARE ONLY) negative  POC SARS CORONAVIRUS 2 AG -  ED negative    EKG   Radiology No results found.  Procedures Procedures (including critical care time)  Medications Ordered in UC Medications - No data to display  Initial Impression / Assessment and Plan / UC Course  I have reviewed the triage vital signs and the nursing notes.  Pertinent labs & imaging results that were available during my care of the patient were reviewed by me and considered in my medical decision making (see chart for details).    Benign exam.  There is no evidence of bacterial infection today.  Treat symptomatically for now. Right ear canal slightly abraded after lavage; will begin Cortisporin otic suspension. Rx for Robitussin AC for night time cough.  Controlled Substance Prescriptions I have consulted the [place] Controlled Substances Registry for this patient, and feel the risk/benefit ratio today is favorable for proceeding with this prescription for a controlled substance.   Followup with Family  Doctor if not improved in about 10 days.  Final Clinical Impressions(s) / UC Diagnoses   Final diagnoses:  Viral URI with cough  Impacted cerumen of right ear     Discharge Instructions      Take plain guaifenesin (1200mg  extended release tabs such as Mucinex) twice daily, with plenty of water, for cough and congestion.  May add Pseudoephedrine (30mg , one or two every 4 to  6 hours) for sinus congestion.  Get adequate rest.   May use Afrin nasal spray (or generic oxymetazoline) each morning for about 5 days and then discontinue.  Also recommend using saline nasal spray several times daily and saline nasal irrigation (AYR is a common brand).  Use Flonase nasal spray each morning after using Afrin nasal spray and saline nasal irrigation. Try warm salt water gargles for sore throat.  Stop all antihistamines (Zyrtec, Nyquil, etc) for now, and other non-prescription cough/cold preparations. May take Tylenol as needed.   To prevent recurrent ear wax blockage, try the following: Soak two cotton balls with mineral oil, and gently place in each ear canal once weekly.  Leave the cotton balls in place for 10 to 20 minutes.  This will help liquefy the ear wax and aid your body's normal elimination process.  If applicable, do not use a hearing aid for 8 hours overnight.  Have your ears cleaned by a health professional every 6 to 12 months.  Avoid using "Q-tips" and ear wax softening solutions  Begin Azithromycin if not improving about 5 to 7 days or if persistent fever develops  (Given a prescription to hold, with an expiration date).  He has had C. Diff colitis in the past, so advised to take a probiotic if he needs to begin the Z-pack.     ED Prescriptions     Medication Sig Dispense Auth. Provider   azithromycin (ZITHROMAX Z-PAK) 250 MG tablet Take 2 tabs today; then begin one tab once daily for 4 more days. 6 tablet Kandra Nicolas, MD   neomycin-polymyxin-hydrocortisone (CORTISPORIN)  3.5-10000-1 OTIC suspension Place 4 drops into the right ear 3 (three) times daily. Use for 5 to 7 days. 7.5 mL Kandra Nicolas, MD   guaiFENesin-codeine 100-10 MG/5ML syrup Take 50mL by mouth at bedtime as needed for cough. 50 mL Kandra Nicolas, MD         Kandra Nicolas, MD 07/04/21 [id number]

## 2021-07-03 NOTE — ED Triage Notes (Signed)
Pt presents with cough, congestion, chills, and sore throat that began Sunday. Negative home covid test
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
## 2022-04-13 ENCOUNTER — Ambulatory Visit
Admission: EM | Admit: 2022-04-13 | Discharge: 2022-04-13 | Disposition: A | Discharge status: Home / Self Care | Payer: Commercial Managed Care - PPO | Attending: Family Medicine | Admitting: Family Medicine

## 2022-04-13 DIAGNOSIS — R21 Rash and other nonspecific skin eruption: Secondary | ICD-10-CM | POA: Diagnosis not present

## 2022-04-13 DIAGNOSIS — L509 Urticaria, unspecified: Secondary | ICD-10-CM

## 2022-04-13 MED ORDER — FEXOFENADINE HCL 180 MG PO TABS: 180.0000 mg | ORAL_TABLET | Freq: Every day | ORAL | 0 refills | Status: DC

## 2022-04-13 MED ORDER — METHYLPREDNISOLONE SODIUM SUCC 125 MG IJ SOLR
125.0000 mg | Freq: Once | INTRAMUSCULAR | Status: AC
  Administered 2022-04-13: 125 mg via INTRAMUSCULAR

## 2022-04-13 MED ORDER — PREDNISONE 10 MG (21) PO TBPK: ORAL_TABLET | Freq: Every day | ORAL | 0 refills | Status: DC

## 2022-04-13 NOTE — ED Triage Notes (Signed)
Pt presents with itchy rash that began yesterday. Pt denies any new environmental changes.

## 2022-04-13 NOTE — Discharge Instructions (Addendum)
Advised patient to take medication as directed with food to completion.  Instructed patient to start Sterapred Unipak tomorrow morning, Tuesday, 04/14/2022.  Advised patient to take Allegra once daily for the next 5 days.  Encouraged patient increase daily water intake while taking this medication.  Advised patient if symptoms worsen and/or unresolved please follow-up PCP or here for further evaluation.

## 2022-04-13 NOTE — ED Provider Notes (Signed)
[name]    CSN: [id number] Arrival date & time: 04/13/22  6387      History   Chief Complaint Chief Complaint  Patient presents with   Rash    HPI Nathaniel Oneill is a 41 y.o. male.   HPI 41 year old male presents with itchy rash of chest/abdomen/arms/upper/lower legs that became yesterday.  Patient denies new laundry/bathing soaps, emollients,/creams, or exposure to animals, new foods/beverages.  PMH significant for obesity, chronic PTSD, and IBS.  Past Medical History:  Diagnosis Date   Chronic post-traumatic stress disorder (PTSD)    IBS (irritable bowel syndrome)    Migraine     Patient Active Problem List   Diagnosis Date Noted   Right lumbar radiculitis 02/12/2020   Right foot pain 02/12/2020   Anxiety disorder 08/05/2011   Chronic post-traumatic stress disorder 08/05/2011    Past Surgical History:  Procedure Laterality Date   AMPUTATION FINGER     COLONOSCOPY     UPPER GASTROINTESTINAL ENDOSCOPY         Home Medications    Prior to Admission medications   Medication Sig Start Date End Date Taking? Authorizing Provider  fexofenadine (ALLEGRA ALLERGY) 180 MG tablet Take 1 tablet (180 mg total) by mouth daily for 15 days. 04/13/22 04/28/22 Yes Trevor Iha, FNP  predniSONE (STERAPRED UNI-PAK 21 TAB) 10 MG (21) TBPK tablet Take by mouth daily. Take 6 tabs by mouth daily  for 2 days, then 5 tabs for 2 days, then 4 tabs for 2 days, then 3 tabs for 2 days, 2 tabs for 2 days, then 1 tab by mouth daily for 2 days 04/13/22  Yes Trevor Iha, FNP  azithromycin (ZITHROMAX Z-PAK) 250 MG tablet Take 2 tabs today; then begin one tab once daily for 4 more days. Patient not taking: Reported on 04/13/2022 07/03/21   Lattie Haw, MD  clonazePAM (KLONOPIN) 2 MG tablet TAKE 1 TABLET BY MOUTH TWICE A DAY AS NEEDED FOR ANXIETY 03/18/19   [provider]  guaiFENesin-codeine 100-10 MG/5ML syrup Take 51mL by mouth at bedtime as needed for cough. 07/03/21    Lattie Haw, MD  ibuprofen (ADVIL) 800 MG tablet Take by mouth. 04/14/12   [provider]  neomycin-polymyxin-hydrocortisone (CORTISPORIN) 3.5-10000-1 OTIC suspension Place 4 drops into the right ear 3 (three) times daily. Use for 5 to 7 days. Patient not taking: Reported on 04/13/2022 07/03/21   Lattie Haw, MD    Family History Family History  Problem Relation Age of Onset   Heart failure Mother    Diabetes Mother    Hypertension Mother    Migraines Mother    Colon polyps Mother    Hypertension Father    Colon polyps Father     Social History Social History   Tobacco Use   Smoking status: Former   Smokeless tobacco: Never  Building services engineer Use: Never used  Substance Use Topics   Alcohol use: Not Currently   Drug use: Yes    Frequency: 1.0 times per week    Types: Marijuana     Allergies   Cephalexin and Duloxetine hcl   Review of Systems Review of Systems  Skin:  Positive for rash.  All other systems reviewed and are negative.    Physical Exam Triage Vital Signs ED Triage Vitals  Enc Vitals Group     BP 04/13/22 0958 124/87     Pulse Rate 04/13/22 0958 63     Resp 04/13/22 0958 14  Temp 04/13/22 0958 98.6 F (37 C)     Temp Source 04/13/22 0958 Oral     SpO2 04/13/22 0958 98 %     Weight --      Height --      Head Circumference --      Peak Flow --      Pain Score 04/13/22 0956 0     Pain Loc --      Pain Edu? --      Excl. in [place]? --    No data found.  Updated Vital Signs BP 124/87 (BP Location: Right Arm)   Pulse 63   Temp 98.6 F (37 C) (Oral)   Resp 14   SpO2 98%      Physical Exam Vitals and nursing note reviewed.  Constitutional:      Appearance: Normal appearance. He is obese.  HENT:     Head: Normocephalic and atraumatic.     Mouth/Throat:     Mouth: Mucous membranes are moist.     Pharynx: Oropharynx is clear.  Eyes:     Extraocular Movements: Extraocular movements intact.     Conjunctiva/sclera:  Conjunctivae normal.     Pupils: Pupils are equal, round, and reactive to light.  Cardiovascular:     Rate and Rhythm: Normal rate and regular rhythm.     Pulses: Normal pulses.     Heart sounds: Normal heart sounds.  Pulmonary:     Effort: Pulmonary effort is normal.     Breath sounds: Normal breath sounds. No wheezing, rhonchi or rales.  Musculoskeletal:        General: Normal range of motion.     Cervical back: Normal range of motion and neck supple.  Skin:    General: Skin is warm and dry.     Comments: Chest/abdomen/lower legs/groin: Mild erythematous pruritic maculopapular eruption with clear dusky centers noted-please see images below  Neurological:     General: No focal deficit present.     Mental Status: He is alert and oriented to person, place, and time.           UC Treatments / Results  Labs (all labs ordered are listed, but only abnormal results are displayed) Labs Reviewed - No data to display  EKG   Radiology No results found.  Procedures Procedures (including critical care time)  Medications Ordered in UC Medications  methylPREDNISolone sodium succinate (SOLU-MEDROL) 125 mg/2 mL injection 125 mg (125 mg Intramuscular Given 04/13/22 1106)    Initial Impression / Assessment and Plan / UC Course  I have reviewed the triage vital signs and the nursing notes.  Pertinent labs & imaging results that were available during my care of the patient were reviewed by me and considered in my medical decision making (see chart for details).     MDM: 1.  Rash and nonspecific skin eruption-IM Solu-Medrol 125 mg given once in clinic, Rx'd Sterapred Unipak; 2.  Hives-Rx'd Allegra. Advised patient to take medication as directed with food to completion.  Instructed patient to start Sterapred Unipak tomorrow morning, Tuesday, 04/14/2022.  Advised patient to take Allegra once daily for the next 5 days.  Encouraged patient increase daily water intake while taking this  medication.  Advised patient if symptoms worsen and/or unresolved please follow-up PCP or here for further evaluation.  Work note provided to patient prior to discharge patient discharged home, hemodynamically stable. Final Clinical Impressions(s) / UC Diagnoses   Final diagnoses:  Rash and nonspecific skin eruption  Hives  Discharge Instructions      Advised patient to take medication as directed with food to completion.  Instructed patient to start Sterapred Unipak tomorrow morning, Tuesday, 04/14/2022.  Advised patient to take Allegra once daily for the next 5 days.  Encouraged patient increase daily water intake while taking this medication.  Advised patient if symptoms worsen and/or unresolved please follow-up PCP or here for further evaluation.     ED Prescriptions     Medication Sig Dispense Auth. Provider   predniSONE (STERAPRED UNI-PAK 21 TAB) 10 MG (21) TBPK tablet Take by mouth daily. Take 6 tabs by mouth daily  for 2 days, then 5 tabs for 2 days, then 4 tabs for 2 days, then 3 tabs for 2 days, 2 tabs for 2 days, then 1 tab by mouth daily for 2 days 42 tablet Trevor Iha, FNP   fexofenadine [facility] ALLERGY) 180 MG tablet Take 1 tablet (180 mg total) by mouth daily for 15 days. 15 tablet Trevor Iha, FNP      PDMP not reviewed this encounter.   Trevor Iha, FNP 04/13/22 1114

## 2022-04-14 ENCOUNTER — Telehealth: Payer: Self-pay

## 2022-04-14 NOTE — Telephone Encounter (Signed)
Pt called stating hes removed 9 ticks from him since seeing Legrand Como in office yesterday. Per Legrand Como, recommend doxycycline. Pt states has had c diff 3 times this year and will consult with his GI tomorrow. Advised to go to ED immediately if develop fever and worsening rash sxs. Pt acknowledged.

## 2022-04-15 ENCOUNTER — Telehealth: Payer: Self-pay | Admitting: Family Medicine

## 2022-04-15 ENCOUNTER — Telehealth: Payer: Self-pay | Admitting: Emergency Medicine

## 2022-04-15 MED ORDER — DOXYCYCLINE HYCLATE 100 MG PO CAPS: 100.0000 mg | ORAL_CAPSULE | Freq: Two times a day (BID) | ORAL | 0 refills | Status: DC

## 2022-04-15 NOTE — Telephone Encounter (Signed)
Patient saw Nathaniel Lofts, FNP on 04/13/2022.  At that time he had a rash.  He was treated with steroids.  He called back yesterday on 04/14/2022 and states he had pulled 9 ticks off of his body.  Mr. Nathaniel Oneill recommended a course of doxycycline.  This was not sent to the pharmacy.  I received a request today to send doxycycline to his pharmacy.

## 2023-10-13 ENCOUNTER — Ambulatory Visit: Payer: Self-pay

## 2023-10-13 ENCOUNTER — Ambulatory Visit
Admission: RE | Admit: 2023-10-13 | Discharge: 2023-10-13 | Disposition: A | Discharge status: Home / Self Care | Payer: Self-pay | Source: Ambulatory Visit | Attending: Family Medicine | Admitting: Family Medicine

## 2023-10-13 VITALS — BP 129/99 | HR 69 | Temp 97.8°F | Resp 18

## 2023-10-13 DIAGNOSIS — J069 Acute upper respiratory infection, unspecified: Secondary | ICD-10-CM

## 2023-10-13 LAB — POCT INFLUENZA A/B
Influenza A, POC: NEGATIVE
Influenza B, POC: NEGATIVE

## 2023-10-13 LAB — POCT RAPID STREP A (OFFICE): Rapid Strep A Screen: NEGATIVE

## 2023-10-13 LAB — POC SARS CORONAVIRUS 2 AG -  ED: SARS Coronavirus 2 Ag: NEGATIVE

## 2023-10-13 MED ORDER — PROMETHAZINE-DM 6.25-15 MG/5ML PO SYRP: 5.0000 mL | ORAL_SOLUTION | Freq: Four times a day (QID) | ORAL | 0 refills | Status: AC | PRN

## 2023-10-13 MED ORDER — METHYLPREDNISOLONE ACETATE 80 MG/ML IJ SUSP
80.0000 mg | Freq: Once | INTRAMUSCULAR | Status: AC
  Administered 2023-10-13: 80 mg via INTRAMUSCULAR

## 2023-10-13 NOTE — Discharge Instructions (Signed)
 The shot of Depo-Medrol will help with the congestion I have prescribed Phenergan DM for the cough.  This should help with the nausea and vomiting Continue to push lots of fluids See your doctor if not improving by next week

## 2023-10-13 NOTE — ED Triage Notes (Signed)
 Pt with c/o cough, congestion, sore throat and intermittent vomiting since Monday. Patient states he is also having abdominal pain and cramping which his hyoscyamine did not help with.

## 2023-10-13 NOTE — ED Provider Notes (Signed)
 [facility]    CSN: [id number] Arrival date & time: 10/13/23  1015      History   Chief Complaint Chief Complaint  Patient presents with   Cough    HPI Nathaniel Oneill is a 43 y.o. male.   Patient states he has colitis.  He is prone to having "upset stomach" anyway.  Currently has upper respiratory infection with congestion, cough, sore throat, fatigue and fever.  He states that the cough is making him feel sick and that he has had several episodes of vomiting.  Symptoms have been present for 3 to 4 days.  No known.    Past Medical History:  Diagnosis Date   Chronic post-traumatic stress disorder (PTSD)    IBS (irritable bowel syndrome)    Migraine     Patient Active Problem List   Diagnosis Date Noted   Right lumbar radiculitis 02/12/2020   Right foot pain 02/12/2020   Anxiety disorder 08/05/2011   Chronic post-traumatic stress disorder 08/05/2011    Past Surgical History:  Procedure Laterality Date   AMPUTATION FINGER     COLONOSCOPY     UPPER GASTROINTESTINAL ENDOSCOPY         Home Medications    Prior to Admission medications   Medication Sig Start Date End Date Taking? Authorizing Provider  hyoscyamine (LEVSIN SL) 0.125 MG SL tablet Take 0.125 mg by mouth every 4 (four) hours as needed for cramping. 06/28/23  Yes [provider]  promethazine-dextromethorphan (PROMETHAZINE-DM) 6.25-15 MG/5ML syrup Take 5 mLs by mouth 4 (four) times daily as needed for cough. 10/13/23  Yes Eustace Moore, MD  clonazePAM (KLONOPIN) 2 MG tablet TAKE 1 TABLET BY MOUTH TWICE A DAY AS NEEDED FOR ANXIETY 03/18/19   [provider]    Family History Family History  Problem Relation Age of Onset   Heart failure Mother    Diabetes Mother    Hypertension Mother    Migraines Mother    Colon polyps Mother    Hypertension Father    Colon polyps Father     Social History Social History   Tobacco Use   Smoking status: Former   Smokeless tobacco:  Never  Vaping Use   Vaping status: Never Used  Substance Use Topics   Alcohol use: Not Currently   Drug use: Yes    Frequency: 1.0 times per week    Types: Marijuana     Allergies   Cephalexin and Duloxetine hcl   Review of Systems Review of Systems See HPI  Physical Exam Triage Vital Signs ED Triage Vitals  Encounter Vitals Group     BP 10/13/23 1029 (!) 129/99     Systolic BP Percentile --      Diastolic BP Percentile --      Pulse Rate 10/13/23 1029 69     Resp 10/13/23 1029 18     Temp 10/13/23 1029 97.8 F (36.6 C)     Temp Source 10/13/23 1029 Oral     SpO2 10/13/23 1029 96 %     Weight --      Height --      Head Circumference --      Peak Flow --      Pain Score 10/13/23 1030 6     Pain Loc --      Pain Education --      Exclude from Growth Chart --    No data found.  Updated Vital Signs BP (!) 129/99 (BP Location: Left  Arm)   Pulse 69   Temp 97.8 F (36.6 C) (Oral)   Resp 18   SpO2 96%      Physical Exam Constitutional:      General: He is not in acute distress.    Appearance: He is well-developed. He is obese. He is ill-appearing.  HENT:     Head: Normocephalic and atraumatic.     Right Ear: Tympanic membrane normal.     Left Ear: Tympanic membrane normal.     Nose: Congestion and rhinorrhea present.     Mouth/Throat:     Mouth: Mucous membranes are moist.     Pharynx: Posterior oropharyngeal erythema present.  Eyes:     Conjunctiva/sclera: Conjunctivae normal.     Pupils: Pupils are equal, round, and reactive to light.  Cardiovascular:     Rate and Rhythm: Normal rate and regular rhythm.     Heart sounds: Normal heart sounds.  Pulmonary:     Effort: Pulmonary effort is normal. No respiratory distress.     Breath sounds: Normal breath sounds.  Abdominal:     General: There is no distension.     Palpations: Abdomen is soft.  Musculoskeletal:        General: Normal range of motion.     Cervical back: Normal range of motion.   Lymphadenopathy:     Cervical: No cervical adenopathy.  Skin:    General: Skin is warm and dry.  Neurological:     Mental Status: He is alert.      UC Treatments / Results  Labs (all labs ordered are listed, but only abnormal results are displayed) Labs Reviewed  POCT INFLUENZA A/B - Normal  POCT RAPID STREP A (OFFICE) - Normal  POC SARS CORONAVIRUS 2 AG -  ED    EKG   Radiology No results found.  Procedures Procedures (including critical care time)  Medications Ordered in UC Medications  methylPREDNISolone acetate (DEPO-MEDROL) injection 80 mg (80 mg Intramuscular Given 10/13/23 1134)    Initial Impression / Assessment and Plan / UC Course  I have reviewed the triage vital signs and the nursing notes.  Pertinent labs & imaging results that were available during my care of the patient were reviewed by me and considered in my medical decision making (see chart for details).     I feel patient has respiratory symptoms likely from a virus.  Antibiotic is discussed but not likely to be effective.  Will give steroids to help with his congestion.  Because of his GI distress we will give IM.  Giving him Phenergan DM to try to help with the coughing and vomiting.  Fluids and rest.  Call or return if not improving by next week Final Clinical Impressions(s) / UC Diagnoses   Final diagnoses:  Acute upper respiratory infection     Discharge Instructions      The shot of Depo-Medrol will help with the congestion I have prescribed Phenergan DM for the cough.  This should help with the nausea and vomiting Continue to push lots of fluids See your doctor if not improving by next week   ED Prescriptions     Medication Sig Dispense Auth. Provider   promethazine-dextromethorphan (PROMETHAZINE-DM) 6.25-15 MG/5ML syrup Take 5 mLs by mouth 4 (four) times daily as needed for cough. 118 mL Eustace Moore, MD      PDMP not reviewed this encounter.   Eustace Moore,  MD 10/13/23 [phone]

## 2024-08-01 ENCOUNTER — Telehealth: Admitting: Emergency Medicine

## 2024-08-01 ENCOUNTER — Ambulatory Visit: Payer: Self-pay

## 2024-08-01 DIAGNOSIS — H1033 Unspecified acute conjunctivitis, bilateral: Secondary | ICD-10-CM

## 2024-08-01 MED ORDER — POLYMYXIN B-TRIMETHOPRIM 10000-0.1 UNIT/ML-% OP SOLN: OPHTHALMIC | 0 refills | Status: AC

## 2024-08-01 NOTE — Progress Notes (Signed)
 We are sorry that you are not feeling well.  Here is how we plan to help!  Based on what you have shared with me it looks like you have conjunctivitis.  Conjunctivitis is a common inflammatory or infectious condition of the eye that is often referred to as pink eye.  In most cases it is contagious (viral or bacterial). However, not all conjunctivitis requires antibiotics (ex. Allergic).  We have made appropriate suggestions for you based upon your presentation.  I have prescribed Polytrim  Ophthalmic drops 1-2 drops 4 times a day times 5 days  Pink eye can be highly contagious.  It is typically spread through direct contact with secretions, or contaminated objects or surfaces that one may have touched.  Strict handwashing is suggested with soap and water is urged.  If not available, use alcohol based had sanitizer.  Avoid unnecessary touching of the eye.  If you wear contact lenses, you will need to refrain from wearing them until you see no white discharge from the eye for at least 24 hours after being on medication.  You should see symptom improvement in 1-2 days after starting the medication regimen.  Call us  if symptoms are not improved in 1-2 days.  Home Care: Wash your hands often! Do not wear your contacts until you complete your treatment plan. Avoid sharing towels, bed linen, personal items with a person who has pink eye. See attention for anyone in your home with similar symptoms.  Get Help Right Away If: Your symptoms do not improve. You develop blurred or loss of vision. Your symptoms worsen (increased discharge, pain or redness)  Your e-visit answers were reviewed by a board certified advanced clinical practitioner to complete your personal care plan.  Depending on the condition, your plan could have included both over the counter or prescription medications.  If there is a problem please reply  once you have received a response from your provider.  Your safety is important to  us .  If you have drug allergies check your prescription carefully.    You can use MyChart to ask questions about today's visit, request a non-urgent call back, or ask for a work or school excuse for 24 hours related to this e-Visit. If it has been greater than 24 hours you will need to follow up with your provider, or enter a new e-Visit to address those concerns.   You will get an e-mail in the next two days asking about your experience.  I hope that your e-visit has been valuable and will speed your recovery. Thank you for using e-visits.  I have spent 5 minutes in review of e-visit questionnaire, review and updating patient chart, medical decision making and response to patient.   Elsie Velma Lunger, PA-C
